# Patient Record
Sex: Female | Born: 1995 | Race: Asian | Hispanic: No | Marital: Single | Smoking: Never smoker
Health system: Southern US, Community
[De-identification: ages and names within clinical notes are randomized; demographics above are authoritative.]

## PROBLEM LIST (undated history)

## (undated) ENCOUNTER — Inpatient Hospital Stay: Payer: Self-pay

## (undated) DIAGNOSIS — Z789 Other specified health status: Secondary | ICD-10-CM

## (undated) DIAGNOSIS — Z8619 Personal history of other infectious and parasitic diseases: Secondary | ICD-10-CM

## (undated) HISTORY — PX: OTHER SURGICAL HISTORY: SHX169

## (undated) HISTORY — DX: Personal history of other infectious and parasitic diseases: Z86.19

## (undated) HISTORY — PX: NO PAST SURGERIES: SHX2092

---
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
## 2016-08-06 ENCOUNTER — Ambulatory Visit (INDEPENDENT_AMBULATORY_CARE_PROVIDER_SITE_OTHER): Payer: Self-pay | Admitting: Obstetrics and Gynecology

## 2016-08-06 VITALS — BP 111/76 | HR 62 | Ht 59.0 in | Wt 103.0 lb

## 2016-08-06 DIAGNOSIS — Z1389 Encounter for screening for other disorder: Secondary | ICD-10-CM

## 2016-08-06 DIAGNOSIS — Z113 Encounter for screening for infections with a predominantly sexual mode of transmission: Secondary | ICD-10-CM

## 2016-08-06 DIAGNOSIS — Z3687 Encounter for antenatal screening for uncertain dates: Secondary | ICD-10-CM

## 2016-08-06 DIAGNOSIS — Z8619 Personal history of other infectious and parasitic diseases: Secondary | ICD-10-CM | POA: Insufficient documentation

## 2016-08-06 DIAGNOSIS — N912 Amenorrhea, unspecified: Secondary | ICD-10-CM

## 2016-08-06 DIAGNOSIS — Z3401 Encounter for supervision of normal first pregnancy, first trimester: Secondary | ICD-10-CM

## 2016-08-06 NOTE — Patient Instructions (Signed)
Pregnancy and Zika Virus Disease Introduction Zika virus disease, or Zika, is an illness that can spread to people from mosquitoes that carry the virus. It may also spread from person to person through infected body fluids. Zika first occurred in [place], but recently it has spread to new areas. The virus occurs in tropical climates. The location of Zika continues to change. Most people who become infected with Zika virus do not develop serious illness. However, Zika may cause birth defects in an unborn baby whose mother is infected with the virus. It may also increase the risk of miscarriage. What are the symptoms of Zika virus disease? In many cases, people who have been infected with Zika virus do not develop any symptoms. If symptoms appear, they usually start about a week after the person is infected. Symptoms are usually mild. They may include:  Fever.  Rash.  Red eyes.  Joint pain. How does Zika virus disease spread? The main way that Zika virus spreads is through the bite of a certain type of mosquito. Unlike most types of mosquitos, which bite only at night, the type of mosquito that carries Zika virus bites both at night and during the day. Zika virus can also spread through sexual contact, through a blood transfusion, and from a mother to her baby before or during birth. Once you have had Zika virus disease, it is unlikely that you will get it again. Can I pass Zika to my baby during pregnancy? Yes, Zika can pass from a mother to her baby before or during birth. What problems can Zika cause for my baby? A woman who is infected with Zika virus while pregnant is at risk of having her baby born with a condition in which the brain or head is smaller than expected (microcephaly). Babies who have microcephaly can have developmental delays, seizures, hearing problems, and vision problems. Having Zika virus disease during pregnancy can also increase the risk of miscarriage. How can Zika  virus disease be prevented? There is no vaccine to prevent Zika. The best way to prevent the disease is to avoid infected mosquitoes and avoid exposure to body fluids that can spread the virus. Avoid any possible exposure to Zika by taking the following precautions. For women and their sex partners:  Avoid traveling to high-risk areas. The locations where Zika is being reported change often. To identify high-risk areas, check the CDC travel website: www.cdc.gov/zika/geo/index.html  If you or your sex partner must travel to a high-risk area, talk with a health care provider before and after traveling.  Take all precautions to avoid mosquito bites if you live in, or travel to, any of the high-risk areas. Insect repellents are safe to use during pregnancy.  Ask your health care provider when it is safe to have sexual contact. For women:  If you are pregnant or trying to become pregnant, avoid sexual contact with persons who may have been exposed to Zika virus, persons who have possible symptoms of Zika, or persons whose history you are unsure about. If you choose to have sexual contact with someone who may have been exposed to Zika virus, use condoms correctly during the entire duration of sexual activity, every time. Do not share sexual devices, as you may be exposed to body fluids.  Ask your health care provider about when it is safe to attempt pregnancy after a possible exposure to Zika virus. What steps should I take to avoid mosquito bites? Take these steps to avoid mosquito bites when you   are in a high-risk area:  Wear loose clothing that covers your arms and legs.  Limit your outdoor activities.  Do not open windows unless they have window screens.  Sleep under mosquito nets.  Use insect repellent. The best insect repellents have:  DEET, picaridin, oil of lemon eucalyptus (OLE), or IR3535 in them.  Higher amounts of an active ingredient in them.  Remember that insect repellents  are safe to use during pregnancy.  Do not use OLE on children who are younger than 3 years of age. Do not use insect repellent on babies who are younger than 2 months of age.  Cover your child's stroller with mosquito netting. Make sure the netting fits snugly and that any loose netting does not cover your child's mouth or nose. Do not use a blanket as a mosquito-protection cover.  Do not apply insect repellent underneath clothing.  If you are using sunscreen, apply the sunscreen before applying the insect repellent.  Treat clothing with permethrin. Do not apply permethrin directly to your skin. Follow label directions for safe use.  Get rid of standing water, where mosquitoes may reproduce. Standing water is often found in items such as buckets, bowls, animal food dishes, and flowerpots. When you return from traveling to any high-risk area, continue taking actions to protect yourself against mosquito bites for 3 weeks, even if you show no signs of illness. This will prevent spreading Zika virus to uninfected mosquitoes. What should I know about the sexual transmission of Zika? People can spread Zika to their sexual partners during vaginal, anal, or oral sex, or by sharing sexual devices. Many people with Zika do not develop symptoms, so a person could spread the disease without knowing that they are infected. The greatest risk is to women who are pregnant or who may become pregnant. Zika virus can live longer in semen than it can live in blood. Couples can prevent sexual transmission of the virus by:  Using condoms correctly during the entire duration of sexual activity, every time. This includes vaginal, anal, and oral sex.  Not sharing sexual devices. Sharing increases your risk of being exposed to body fluid from another person.  Avoiding all sexual activity until your health care provider says it is safe. Should I be tested for Zika virus? A sample of your blood can be tested for Zika  virus. A pregnant woman should be tested if she may have been exposed to the virus or if she has symptoms of Zika. She may also have additional tests done during her pregnancy, such ultrasound testing. Talk with your health care provider about which tests are recommended. This information is not intended to replace advice given to you by your health care provider. Make sure you discuss any questions you have with your health care provider. Document Released: 04/04/2015 Document Revised: 12/20/2015 Document Reviewed: 03/28/2015  2017 Elsevier Minor Illnesses and Medications in Pregnancy  Cold/Flu:  Sudafed for congestion- Robitussin (plain) for cough- Tylenol for discomfort.  Please follow the directions on the label.  Try not to take any more than needed.  OTC Saline nasal spray and air humidifier or cool-mist  Vaporizer to sooth nasal irritation and to loosen congestion.  It is also important to increase intake of non carbonated fluids, especially if you have a fever.  Constipation:  Colace-2 capsules at bedtime; Metamucil- follow directions on label; Senokot- 1 tablet at bedtime.  Any one of these medications can be used.  It is also very important to increase   fluids and fruits along with regular exercise.  If problem persists please call the office.  Diarrhea:  Kaopectate as directed on the label.  Eat a bland diet and increase fluids.  Avoid highly seasoned foods.  Headache:  Tylenol 1 or 2 tablets every 3-4 hours as needed  Indigestion:  Maalox, Mylanta, Tums or Rolaids- as directed on label.  Also try to eat small meals and avoid fatty, greasy or spicy foods.  Nausea with or without Vomiting:  Nausea in pregnancy is caused by increased levels of hormones in the body which influence the digestive system and cause irritation when stomach acids accumulate.  Symptoms usually subside after 1st trimester of pregnancy.  Try the following: 1. Keep saltines, graham crackers or dry toast by your bed to  eat upon awakening. 2. Don't let your stomach get empty.  Try to eat 5-6 small meals per day instead of 3 large ones. 3. Avoid greasy fatty or highly seasoned foods.  4. Take OTC Unisom 1 tablet at bed time along with OTC Vitamin B6 25-50 mg 3 times per day.    If nausea continues with vomiting and you are unable to keep down food and fluids you may need a prescription medication.  Please notify your provider.   Sore throat:  Chloraseptic spray, throat lozenges and or plain Tylenol.  Vaginal Yeast Infection:  OTC Monistat for 7 days as directed on label.  If symptoms do not resolve within a week notify provider.  If any of the above problems do not subside with recommended treatment please call the office for further assistance.   Do not take Aspirin, Advil, Motrin or Ibuprofen.  * * OTC= Over the counter Hyperemesis Gravidarum Hyperemesis gravidarum is a severe form of nausea and vomiting that happens during pregnancy. Hyperemesis is worse than morning sickness. It may cause you to have nausea or vomiting all day for many days. It may keep you from eating and drinking enough food and liquids. Hyperemesis usually occurs during the first half (the first 20 weeks) of pregnancy. It often goes away once a woman is in her second half of pregnancy. However, sometimes hyperemesis continues through an entire pregnancy. What are the causes? The cause of this condition is not known. It may be related to changes in chemicals (hormones) in the body during pregnancy, such as the high level of pregnancy hormone (human chorionic gonadotropin) or the increase in the female sex hormone (estrogen). What are the signs or symptoms? Symptoms of this condition include:  Severe nausea and vomiting.  Nausea that does not go away.  Vomiting that does not allow you to keep any food down.  Weight loss.  Body fluid loss (dehydration).  Having no desire to eat, or not liking food that you have previously  enjoyed. How is this diagnosed? This condition may be diagnosed based on:  A physical exam.  Your medical history.  Your symptoms.  Blood tests.  Urine tests. How is this treated? This condition may be managed with medicine. If medicines to do not help relieve nausea and vomiting, you may need to receive fluids through an IV tube at the hospital. Follow these instructions at home:  Take over-the-counter and prescription medicines only as told by your health care provider.  Avoid iron pills and multivitamins that contain iron for the first 3-4 months of pregnancy. If you take prescription iron pills, do not stop taking them unless your health care provider approves.  Take the following actions to help   prevent nausea and vomiting:  In the morning, before getting out of bed, try eating a couple of dry crackers or a piece of toast.  Avoid foods and smells that upset your stomach. Fatty and spicy foods may make nausea worse.  Eat 5-6 small meals a day.  Do not drink fluids while eating meals. Drink between meals.  Eat or suck on things that have ginger in them. Ginger can help relieve nausea.  Avoid food preparation. The smell of food can spoil your appetite or trigger nausea.  Follow instructions from your health care provider about eating or drinking restrictions.  For snacks, eat high-protein foods, such as cheese.  Keep all follow-up and pre-birth (prenatal) visits as told by your health care provider. This is important. Contact a health care provider if:  You have pain in your abdomen.  You have a severe headache.  You have vision problems.  You are losing weight. Get help right away if:  You cannot drink fluids without vomiting.  You vomit blood.  You have constant nausea and vomiting.  You are very weak.  You are very thirsty.  You feel dizzy.  You faint.  You have a fever or other symptoms that last for more than 2-3 days.  You have a fever and  your symptoms suddenly get worse. Summary  Hyperemesis gravidarum is a severe form of nausea and vomiting that happens during pregnancy.  Making some changes to your eating habits may help relieve nausea and vomiting.  This condition may be managed with medicine.  If medicines to do not help relieve nausea and vomiting, you may need to receive fluids through an IV tube at the hospital. This information is not intended to replace advice given to you by your health care provider. Make sure you discuss any questions you have with your health care provider. Document Released: 07/14/2005 Document Revised: 03/12/2016 Document Reviewed: 03/12/2016 Elsevier Interactive Patient Education  2017 [employer] First Trimester of Pregnancy The first trimester of pregnancy is from week 1 until the end of week 12 (months 1 through 3). During this time, your baby will begin to develop inside you. At 6-8 weeks, the eyes and face are formed, and the heartbeat can be seen on ultrasound. At the end of 12 weeks, all the baby's organs are formed. Prenatal care is all the medical care you receive before the birth of your baby. Make sure you get good prenatal care and follow all of your doctor's instructions. Follow these instructions at home: Medicines  Take medicine only as told by your doctor. Some medicines are safe and some are not during pregnancy.  Take your prenatal vitamins as told by your doctor.  Take medicine that helps you poop (stool softener) as needed if your doctor says it is okay. Diet  Eat regular, healthy meals.  Your doctor will tell you the amount of weight gain that is right for you.  Avoid raw meat and uncooked cheese.  If you feel sick to your stomach (nauseous) or throw up (vomit):  Eat 4 or 5 small meals a day instead of 3 large meals.  Try eating a few soda crackers.  Drink liquids between meals instead of during meals.  If you have a hard time pooping  (constipation):  Eat high-fiber foods like fresh vegetables, fruit, and whole grains.  Drink enough fluids to keep your pee (urine) clear or pale yellow. Activity and Exercise  Exercise only as told by your doctor. Stop exercising if   you have cramps or pain in your lower belly (abdomen) or low back.  Try to avoid standing for long periods of time. Move your legs often if you must stand in one place for a long time.  Avoid heavy lifting.  Wear low-heeled shoes. Sit and stand up straight.  You can have sex unless your doctor tells you not to. Relief of Pain or Discomfort  Wear a good support bra if your breasts are sore.  Take warm water baths (sitz baths) to soothe pain or discomfort caused by hemorrhoids. Use hemorrhoid cream if your doctor says it is okay.  Rest with your legs raised if you have leg cramps or low back pain.  Wear support hose if you have puffy, bulging veins (varicose veins) in your legs. Raise (elevate) your feet for 15 minutes, 3-4 times a day. Limit salt in your diet. Prenatal Care  Schedule your prenatal visits by the twelfth week of pregnancy.  Write down your questions. Take them to your prenatal visits.  Keep all your prenatal visits as told by your doctor. Safety  Wear your seat belt at all times when driving.  Make a list of emergency phone numbers. The list should include numbers for family, friends, the hospital, and police and fire departments. General Tips  Ask your doctor for a referral to a local prenatal class. Begin classes no later than at the start of month 6 of your pregnancy.  Ask for help if you need counseling or help with nutrition. Your doctor can give you advice or tell you where to go for help.  Do not use hot tubs, steam rooms, or saunas.  Do not douche or use tampons or scented sanitary pads.  Do not cross your legs for long periods of time.  Avoid litter boxes and soil used by cats.  Avoid all smoking, herbs, and  alcohol. Avoid drugs not approved by your doctor.  Do not use any tobacco products, including cigarettes, chewing tobacco, and electronic cigarettes. If you need help quitting, ask your doctor. You may get counseling or other support to help you quit.  Visit your dentist. At home, brush your teeth with a soft toothbrush. Be gentle when you floss. Get help if:  You are dizzy.  You have mild cramps or pressure in your lower belly.  You have a nagging pain in your belly area.  You continue to feel sick to your stomach, throw up, or have watery poop (diarrhea).  You have a bad smelling fluid coming from your vagina.  You have pain with peeing (urination).  You have increased puffiness (swelling) in your face, hands, legs, or ankles. Get help right away if:  You have a fever.  You are leaking fluid from your vagina.  You have spotting or bleeding from your vagina.  You have very bad belly cramping or pain.  You gain or lose weight rapidly.  You throw up blood. It may look like coffee grounds.  You are around people who have German measles, fifth disease, or chickenpox.  You have a very bad headache.  You have shortness of breath.  You have any kind of trauma, such as from a fall or a car accident. This information is not intended to replace advice given to you by your health care provider. Make sure you discuss any questions you have with your health care provider. Document Released: 12/31/2007 Document Revised: 12/20/2015 Document Reviewed: 05/24/2013 Elsevier Interactive Patient Education  2017 [employer] Inc. Commonly Asked Questions   During Pregnancy  Cats: A parasite can be excreted in cat feces.  To avoid exposure you need to have another person empty the little box.  If you must empty the litter box you will need to wear gloves.  Wash your hands after handling your cat.  This parasite can also be found in raw or undercooked meat so this should also be avoided.  Colds,  Sore Throats, Flu: Please check your medication sheet to see what you can take for symptoms.  If your symptoms are unrelieved by these medications please call the office.  Dental Work: Most any dental work your dentist recommends is permitted.  X-rays should only be taken during the first trimester if absolutely necessary.  Your abdomen should be shielded with a lead apron during all x-rays.  Please notify your provider prior to receiving any x-rays.  Novocaine is fine; gas is not recommended.  If your dentist requires a note from us prior to dental work please call the office and we will provide one for you.  Exercise: Exercise is an important part of staying healthy during your pregnancy.  You may continue most exercises you were accustomed to prior to pregnancy.  Later in your pregnancy you will most likely notice you have difficulty with activities requiring balance like riding a bicycle.  It is important that you listen to your body and avoid activities that put you at a higher risk of falling.  Adequate rest and staying well hydrated are a must!  If you have questions about the safety of specific activities ask your provider.    Exposure to Children with illness: Try to avoid obvious exposure; report any symptoms to us when noted,  If you have chicken pos, red measles or mumps, you should be immune to these diseases.   Please do not take any vaccines while pregnant unless you have checked with your OB provider.  Fetal Movement: After 28 weeks we recommend you do "kick counts" twice daily.  Lie or sit down in a calm quiet environment and count your baby movements "kicks".  You should feel your baby at least 10 times per hour.  If you have not felt 10 kicks within the first hour get up, walk around and have something sweet to eat or drink then repeat for an additional hour.  If count remains less than 10 per hour notify your provider.  Fumigating: Follow your pest control agent's advice as to how long  to stay out of your home.  Ventilate the area well before re-entering.  Hemorrhoids:   Most over-the-counter preparations can be used during pregnancy.  Check your medication to see what is safe to use.  It is important to use a stool softener or fiber in your diet and to drink lots of liquids.  If hemorrhoids seem to be getting worse please call the office.   Hot Tubs:  Hot tubs Jacuzzis and saunas are not recommended while pregnant.  These increase your internal body temperature and should be avoided.  Intercourse:  Sexual intercourse is safe during pregnancy as long as you are comfortable, unless otherwise advised by your provider.  Spotting may occur after intercourse; report any bright red bleeding that is heavier than spotting.  Labor:  If you know that you are in labor, please go to the hospital.  If you are unsure, please call the office and let us help you decide what to do.  Lifting, straining, etc:  If your job requires heavy lifting or   straining please check with your provider for any limitations.  Generally, you should not lift items heavier than that you can lift simply with your hands and arms (no back muscles)  Painting:  Paint fumes do not harm your pregnancy, but may make you ill and should be avoided if possible.  Latex or water based paints have less odor than oils.  Use adequate ventilation while painting.  Permanents & Hair Color:  Chemicals in hair dyes are not recommended as they cause increase hair dryness which can increase hair loss during pregnancy.  " Highlighting" and permanents are allowed.  Dye may be absorbed differently and permanents may not hold as well during pregnancy.  Sunbathing:  Use a sunscreen, as skin burns easily during pregnancy.  Drink plenty of fluids; avoid over heating.  Tanning Beds:  Because their possible side effects are still unknown, tanning beds are not recommended.  Ultrasound Scans:  Routine ultrasounds are performed at approximately 20  weeks.  You will be able to see your baby's general anatomy an if you would like to know the gender this can usually be determined as well.  If it is questionable when you conceived you may also receive an ultrasound early in your pregnancy for dating purposes.  Otherwise ultrasound exams are not routinely performed unless there is a medical necessity.  Although you can request a scan we ask that you pay for it when conducted because insurance does not cover " patient request" scans.  Work: If your pregnancy proceeds without complications you may work until your due date, unless your physician or employer advises otherwise.  Round Ligament Pain/Pelvic Discomfort:  Sharp, shooting pains not associated with bleeding are fairly common, usually occurring in the second trimester of pregnancy.  They tend to be worse when standing up or when you remain standing for long periods of time.  These are the result of pressure of certain pelvic ligaments called "round ligaments".  Rest, Tylenol and heat seem to be the most effective relief.  As the womb and fetus grow, they rise out of the pelvis and the discomfort improves.  Please notify the office if your pain seems different than that described.  It may represent a more serious condition.   

## 2016-08-06 NOTE — Progress Notes (Signed)
Jaime Simmons presents for NOB nurse interview visit. [facility]  LMP: 06/17/2016-approximate.  G-1.  P-0000. Pregnancy education material explained and given. No cats in the home. NOB labs ordered.  HIV labs and Drug screen were explained optional and she did not decline. Drug screen ordered. Has hx genital herpes. Pt states she was smoking marijuana but has now decreased to 1x weekly and is quitting. PNV encouraged. Genetic screening, pt may want to have the MaternIT done and will need to schedule about 10 wks or may wait to discuss with provider. Pt. To follow up with provider in _4_ weeks for NOB physical.  All questions answered.

## 2016-08-08 ENCOUNTER — Ambulatory Visit (INDEPENDENT_AMBULATORY_CARE_PROVIDER_SITE_OTHER): Payer: Medicaid Other

## 2016-08-08 DIAGNOSIS — Z3687 Encounter for antenatal screening for uncertain dates: Secondary | ICD-10-CM

## 2016-08-08 DIAGNOSIS — Z3401 Encounter for supervision of normal first pregnancy, first trimester: Secondary | ICD-10-CM | POA: Diagnosis not present

## 2016-08-09 LAB — CULTURE, OB URINE

## 2016-08-09 LAB — CBC WITH DIFFERENTIAL/PLATELET
BASOS ABS: 0 10*3/uL (ref 0.0–0.2)
BASOS: 0 %
EOS (ABSOLUTE): 0 10*3/uL (ref 0.0–0.4)
Eos: 1 %
Hematocrit: 39 % (ref 34.0–46.6)
Hemoglobin: 13.4 g/dL (ref 11.1–15.9)
Immature Grans (Abs): 0 10*3/uL (ref 0.0–0.1)
Immature Granulocytes: 0 %
Lymphocytes Absolute: 2.4 10*3/uL (ref 0.7–3.1)
Lymphs: 33 %
MCH: 25.8 pg — AB (ref 26.6–33.0)
MCHC: 34.4 g/dL (ref 31.5–35.7)
MCV: 75 fL — AB (ref 79–97)
MONOS ABS: 0.6 10*3/uL (ref 0.1–0.9)
Monocytes: 8 %
NEUTROS PCT: 58 %
Neutrophils Absolute: 4.1 10*3/uL (ref 1.4–7.0)
PLATELETS: 207 10*3/uL (ref 150–379)
RBC: 5.2 x10E6/uL (ref 3.77–5.28)
RDW: 15.3 % (ref 12.3–15.4)
WBC: 7.1 10*3/uL (ref 3.4–10.8)

## 2016-08-09 LAB — ABO

## 2016-08-09 LAB — URINE CULTURE, OB REFLEX: Organism ID, Bacteria: NO GROWTH

## 2016-08-09 LAB — HEPATITIS B SURFACE ANTIGEN: HEP B S AG: NEGATIVE

## 2016-08-09 LAB — ANTIBODY SCREEN: ANTIBODY SCREEN: NEGATIVE

## 2016-08-09 LAB — GC/CHLAMYDIA PROBE AMP
Chlamydia trachomatis, NAA: NEGATIVE
NEISSERIA GONORRHOEAE BY PCR: NEGATIVE

## 2016-08-09 LAB — RPR: RPR Ser Ql: NONREACTIVE

## 2016-08-09 LAB — HIV ANTIBODY (ROUTINE TESTING W REFLEX): HIV Screen 4th Generation wRfx: NONREACTIVE

## 2016-08-09 LAB — SICKLE CELL SCREEN: SICKLE CELL SCREEN: POSITIVE — AB

## 2016-08-09 LAB — VARICELLA ZOSTER ANTIBODY, IGG: VARICELLA: 605 {index} (ref >165)

## 2016-08-09 LAB — RUBELLA SCREEN: Rubella Antibodies, IGG: 26.2 index (ref >0.99)

## 2016-08-09 LAB — RH TYPE: Rh Factor: POSITIVE

## 2016-08-16 LAB — URINALYSIS, ROUTINE W REFLEX MICROSCOPIC
Bilirubin, UA: NEGATIVE
Leukocytes, UA: NEGATIVE
Nitrite, UA: NEGATIVE
PH UA: 5 (ref 5.0–7.5)
Protein, UA: NEGATIVE
RBC, UA: NEGATIVE
Specific Gravity, UA: >=1.03 — AB (ref 1.005–1.030)
UUROB: 0.2 mg/dL (ref 0.2–1.0)

## 2016-08-16 LAB — MONITOR DRUG PROFILE 14(MW)
Amphetamine Scrn, Ur: NEGATIVE ng/mL
BARBITURATE SCREEN URINE: NEGATIVE ng/mL
BENZODIAZEPINE SCREEN, URINE: NEGATIVE ng/mL
BUPRENORPHINE, URINE: NEGATIVE ng/mL
CREATININE(CRT), U: 151.3 mg/dL (ref 20.0–300.0)
Cocaine (Metab) Scrn, Ur: NEGATIVE ng/mL
Fentanyl, Urine: NEGATIVE pg/mL
MEPERIDINE SCREEN, URINE: NEGATIVE ng/mL
METHADONE SCREEN, URINE: NEGATIVE ng/mL
OPIATE SCREEN URINE: NEGATIVE ng/mL
OXYCODONE+OXYMORPHONE UR QL SCN: NEGATIVE ng/mL
PH UR, DRUG SCRN: 5.4 (ref 4.5–8.9)
PHENCYCLIDINE QUANTITATIVE URINE: NEGATIVE ng/mL
Propoxyphene Scrn, Ur: NEGATIVE ng/mL
SPECIFIC GRAVITY: 1.021
Tramadol Screen, Urine: NEGATIVE ng/mL

## 2016-08-16 LAB — NICOTINE SCREEN, URINE: Cotinine Ql Scrn, Ur: NEGATIVE ng/mL

## 2016-08-16 LAB — CANNABINOID (GC/MS), URINE
CARBOXY THC UR: 21 ng/mL
Cannabinoid: POSITIVE — AB

## 2016-08-18 ENCOUNTER — Telehealth: Payer: Self-pay

## 2016-08-18 DIAGNOSIS — Z131 Encounter for screening for diabetes mellitus: Secondary | ICD-10-CM

## 2016-08-18 NOTE — Telephone Encounter (Signed)
Called pt informed her of the information below, pt gave verbal understanding. Stressed importance of having partner tested.

## 2016-08-18 NOTE — Telephone Encounter (Signed)
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  Would recommend partner testing.   Also with 3+ glucosuria, will need early glucola to r/o diabetes when she comes for her NOB visit as long as she is not having any nausea/vomting.

## 2016-08-28 ENCOUNTER — Telehealth: Payer: Self-pay | Admitting: Obstetrics and Gynecology

## 2016-08-28 NOTE — Telephone Encounter (Signed)
Called pt she states that she is concerned because she will not be able to make it to her 09/04/16 appt due to work. Pt states that she has strict restrictions regarding leaving work as she has only been working there a few days. Pt states she works 8-5. Advised pt that although it is not ideal we can push her appt out a few weeks until she has completed training. 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 Advised pt to call back sooner if issues arise. Pt gave verbal understanding.

## 2016-08-28 NOTE — Telephone Encounter (Signed)
CALLER STATED SHE HAD AN EMERGENCY QUESTIONS

## 2016-08-29 ENCOUNTER — Other Ambulatory Visit: Payer: Self-pay

## 2016-08-29 ENCOUNTER — Encounter: Payer: Self-pay | Admitting: Certified Nurse Midwife

## 2016-08-29 ENCOUNTER — Ambulatory Visit (INDEPENDENT_AMBULATORY_CARE_PROVIDER_SITE_OTHER): Payer: Medicaid Other | Admitting: Certified Nurse Midwife

## 2016-08-29 VITALS — BP 106/68 | HR 72 | Wt 101.2 lb

## 2016-08-29 DIAGNOSIS — Z3491 Encounter for supervision of normal pregnancy, unspecified, first trimester: Secondary | ICD-10-CM

## 2016-08-29 DIAGNOSIS — Z131 Encounter for screening for diabetes mellitus: Secondary | ICD-10-CM

## 2016-08-29 LAB — POCT URINALYSIS DIPSTICK
Bilirubin, UA: NEGATIVE
Blood, UA: NEGATIVE
Glucose, UA: NEGATIVE
Ketones, UA: NEGATIVE
NITRITE UA: NEGATIVE
Protein, UA: NEGATIVE
Spec Grav, UA: 1.025
UROBILINOGEN UA: NEGATIVE
pH, UA: 5

## 2016-08-29 NOTE — Progress Notes (Signed)
Has had 2 episodes of vomiting denies nausea.

## 2016-08-29 NOTE — Patient Instructions (Signed)
Second Trimester of Pregnancy The second trimester is from week 13 through week 28, month 4 through 6. This is often the time in pregnancy that you feel your best. Often times, morning sickness has lessened or quit. You may have more energy, and you may get hungry more often. Your unborn baby (fetus) is growing rapidly. At the end of the sixth month, he or she is about 9 inches long and weighs about 1 pounds. You will likely feel the baby move (quickening) between 18 and 20 weeks of pregnancy. Follow these instructions at home:  Avoid all smoking, herbs, and alcohol. Avoid drugs not approved by your doctor.  Do not use any tobacco products, including cigarettes, chewing tobacco, and electronic cigarettes. If you need help quitting, ask your doctor. You may get counseling or other support to help you quit.  Only take medicine as told by your doctor. Some medicines are safe and some are not during pregnancy.  Exercise only as told by your doctor. Stop exercising if you start having cramps.  Eat regular, healthy meals.  Wear a good support bra if your breasts are tender.  Do not use hot tubs, steam rooms, or saunas.  Wear your seat belt when driving.  Avoid raw meat, uncooked cheese, and liter boxes and soil used by cats.  Take your prenatal vitamins.  Take 1500-2000 milligrams of calcium daily starting at the 20th week of pregnancy until you deliver your baby.  Try taking medicine that helps you poop (stool softener) as needed, and if your doctor approves. Eat more fiber by eating fresh fruit, vegetables, and whole grains. Drink enough fluids to keep your pee (urine) clear or pale yellow.  Take warm water baths (sitz baths) to soothe pain or discomfort caused by hemorrhoids. Use hemorrhoid cream if your doctor approves.  If you have puffy, bulging veins (varicose veins), wear support hose. Raise (elevate) your feet for 15 minutes, 3-4 times a day. Limit salt in your diet.  Avoid heavy  lifting, wear low heals, and sit up straight.  Rest with your legs raised if you have leg cramps or low back pain.  Visit your dentist if you have not gone during your pregnancy. Use a soft toothbrush to brush your teeth. Be gentle when you floss.  You can have sex (intercourse) unless your doctor tells you not to.  Go to your doctor visits. Get help if:  You feel dizzy.  You have mild cramps or pressure in your lower belly (abdomen).  You have a nagging pain in your belly area.  You continue to feel sick to your stomach (nauseous), throw up (vomit), or have watery poop (diarrhea).  You have bad smelling fluid coming from your vagina.  You have pain with peeing (urination). Get help right away if:  You have a fever.  You are leaking fluid from your vagina.  You have spotting or bleeding from your vagina.  You have severe belly cramping or pain.  You lose or gain weight rapidly.  You have trouble catching your breath and have chest pain.  You notice sudden or extreme puffiness (swelling) of your face, hands, ankles, feet, or legs.  You have not felt the baby move in over an hour.  You have severe headaches that do not go away with medicine.  You have vision changes. This information is not intended to replace advice given to you by your health care provider. Make sure you discuss any questions you have with your health care   provider. Document Released: 10/08/2009 Document Revised: 12/20/2015 Document Reviewed: 09/14/2012 Elsevier Interactive Patient Education  2017 Elsevier Inc.  

## 2016-08-29 NOTE — Progress Notes (Signed)
NEW OB HISTORY AND PHYSICAL  SUBJECTIVE:       Jaime Simmons is a 21 y.o. G1P0 female, Patient's last menstrual period was 06/17/2016 (approximate)., Estimated Date of Delivery: 03/24/17, [id number], presents today for establishment of Prenatal Care.  She complains of nausea with vomiting for the last two (2) days. Denies difficulty breathing or respiratory distress, chest pain, abdominal pain, vaginal bleeding, and leg pain or swelling.   Jaime Simmons is starting a new job [place] the [employer].   She is aware that her sickle cell screen was positive. The FOB reports that he does not carry the sickle cell trait.    Gynecologic History  Patient's last menstrual period was 06/17/2016 (approximate).   Obstetric History OB History  Gravida Para Term Preterm AB Living  1            SAB TAB Ectopic Multiple Live Births               # Outcome Date GA Lbr Len/2nd Weight Sex Delivery Anes PTL Lv  1 Current               Past Medical History:  Diagnosis Date  . H/O herpes genitalis     Past Surgical History:  Procedure Laterality Date  . none      Current Outpatient Prescriptions on File Prior to Visit  Medication Sig Dispense Refill  . acyclovir (ZOVIRAX) 800 MG tablet Take 800 mg by mouth 5 (five) times daily.    . Prenatal Vit-Fe Fumarate-FA (PRENATAL VITAMINS) 28-0.8 MG TABS Take 1 tablet by mouth daily.     No current facility-administered medications on file prior to visit.     No Known Allergies  Social History   Social History  . Marital status: Single    Spouse name: N/A  . Number of children: N/A  . Years of education: N/A   Occupational History  . Not on file.   Social History Main Topics  . Smoking status: Never Smoker  . Smokeless tobacco: Never Used  . Alcohol use No     Comment: occasionally  . Drug use: No  . Sexual activity: Yes    Partners: Male   Other Topics Concern  . Not on file   Social History Narrative  . No narrative on file    History  reviewed. No pertinent family history.  The following portions of the patient's history were reviewed and updated as appropriate: allergies, current medications, past OB history, past medical history, past surgical history, past family history, past social history, and problem list.  OBJECTIVE: Initial Physical Exam (New OB)  GENERAL APPEARANCE: alert, well appearing, in no apparent distress  HEAD: normocephalic, atraumatic  MOUTH: mucous membranes moist, pharynx normal without lesions and dental hygiene good  THYROID: no thyromegaly or masses present  BREASTS: no masses noted, no significant tenderness, no palpable axillary nodes, no skin changes  LUNGS: clear to auscultation, no wheezes, rales or rhonchi, symmetric air entry  HEART: regular rate and rhythm, no murmurs  ABDOMEN: soft, nontender, nondistended, no abnormal masses, no epigastric pain  EXTREMITIES: no redness or tenderness in the calves or thighs  SKIN: normal coloration and turgor, no rashes  LYMPH NODES: no adenopathy palpable  NEUROLOGIC: alert, oriented, normal speech, no focal findings or movement disorder noted  PELVIC EXAM EXTERNAL GENITALIA: normal appearing vulva with no masses, tenderness or lesions VAGINA: no abnormal discharge or lesions CERVIX: no lesions or cervical motion tenderness UTERUS: gravid ADNEXA: no  masses palpable and nontender  ASSESSMENT: Normal pregnancy Desires genetic screening   PLAN: Prenatal care Early glucola and MaterniT today Reviewed red flag symptoms and when to call RTC x 4 weeks for ROB See orders   Gunnar Bulla, CNM

## 2016-08-30 LAB — GLUCOSE, 1 HOUR GESTATIONAL: Gestational Diabetes Screen: 74 mg/dL (ref 65–139)

## 2016-09-04 ENCOUNTER — Encounter: Payer: Medicaid Other | Admitting: Obstetrics and Gynecology

## 2016-09-04 ENCOUNTER — Other Ambulatory Visit: Payer: Self-pay

## 2016-09-06 LAB — MATERNIT 21 PLUS CORE, BLOOD
Chromosome 13: NEGATIVE
Chromosome 18: NEGATIVE
Chromosome 21: NEGATIVE
Y Chromosome: NOT DETECTED

## 2016-09-09 NOTE — Progress Notes (Signed)
Would you contact patient with MaterniT results. Low risk, gender female if desired. Thanks, JML

## 2016-09-10 ENCOUNTER — Telehealth: Payer: Self-pay

## 2016-09-10 NOTE — Telephone Encounter (Signed)
Left message on oatients voice mail to please reutrn call.

## 2016-09-10 NOTE — Telephone Encounter (Signed)
-----   Message from Jenkins Michelle Lawhorn, CNM sent at 09/09/2016  5:50 PM EST ----- Would you contact patient with MaterniT results. Low risk, gender female if desired. Thanks, JML 

## 2016-09-10 NOTE — Telephone Encounter (Signed)
Left message on pts voicemail to return call.

## 2016-09-11 NOTE — Telephone Encounter (Signed)
Message left on patients voice mail of low risk per provider. Also pt may call to receive the gender.

## 2016-09-11 NOTE — Telephone Encounter (Signed)
-----   Message from Gunnar BullaJenkins Michelle Lawhorn, CNM sent at 09/09/2016  5:50 PM EST ----- Would you contact patient with MaterniT results. Low risk, gender female if desired. Thanks, JML

## 2016-09-26 ENCOUNTER — Encounter: Payer: Self-pay | Admitting: Certified Nurse Midwife

## 2016-09-26 ENCOUNTER — Ambulatory Visit (INDEPENDENT_AMBULATORY_CARE_PROVIDER_SITE_OTHER): Payer: Medicaid Other | Admitting: Certified Nurse Midwife

## 2016-09-26 VITALS — BP 108/80 | HR 77 | Wt 103.4 lb

## 2016-09-26 DIAGNOSIS — Z3402 Encounter for supervision of normal first pregnancy, second trimester: Secondary | ICD-10-CM

## 2016-09-26 LAB — POCT URINALYSIS DIPSTICK
Bilirubin, UA: NEGATIVE
Glucose, UA: NEGATIVE
Ketones, UA: NEGATIVE
Leukocytes, UA: NEGATIVE
NITRITE UA: NEGATIVE
PH UA: 5
PROTEIN UA: NEGATIVE
RBC UA: NEGATIVE
UROBILINOGEN UA: NEGATIVE

## 2016-09-26 NOTE — Addendum Note (Signed)
Addended by: Brooke DareSICK, Luke Falero L on: 09/26/2016 04:57 PM   Modules accepted: Orders

## 2016-09-26 NOTE — Progress Notes (Signed)
ROB-Pt doing well. WIC visit yesterday and is now drinking Ensure. FOB-Works at [employer], advised for him to get sickle cell screen at work. Pt loves her new job. Reviewed red flag symptoms and when to call. RTC x 5 weeks for anatomy scan and ROB.

## 2016-09-26 NOTE — Patient Instructions (Signed)
Second Trimester of Pregnancy The second trimester is from week 13 through week 28, month 4 through 6. This is often the time in pregnancy that you feel your best. Often times, morning sickness has lessened or quit. You may have more energy, and you may get hungry more often. Your unborn baby (fetus) is growing rapidly. At the end of the sixth month, he or she is about 9 inches long and weighs about 1 pounds. You will likely feel the baby move (quickening) between 18 and 20 weeks of pregnancy. Follow these instructions at home:  Avoid all smoking, herbs, and alcohol. Avoid drugs not approved by your doctor.  Do not use any tobacco products, including cigarettes, chewing tobacco, and electronic cigarettes. If you need help quitting, ask your doctor. You may get counseling or other support to help you quit.  Only take medicine as told by your doctor. Some medicines are safe and some are not during pregnancy.  Exercise only as told by your doctor. Stop exercising if you start having cramps.  Eat regular, healthy meals.  Wear a good support bra if your breasts are tender.  Do not use hot tubs, steam rooms, or saunas.  Wear your seat belt when driving.  Avoid raw meat, uncooked cheese, and liter boxes and soil used by cats.  Take your prenatal vitamins.  Take 1500-2000 milligrams of calcium daily starting at the 20th week of pregnancy until you deliver your baby.  Try taking medicine that helps you poop (stool softener) as needed, and if your doctor approves. Eat more fiber by eating fresh fruit, vegetables, and whole grains. Drink enough fluids to keep your pee (urine) clear or pale yellow.  Take warm water baths (sitz baths) to soothe pain or discomfort caused by hemorrhoids. Use hemorrhoid cream if your doctor approves.  If you have puffy, bulging veins (varicose veins), wear support hose. Raise (elevate) your feet for 15 minutes, 3-4 times a day. Limit salt in your diet.  Avoid heavy  lifting, wear low heals, and sit up straight.  Rest with your legs raised if you have leg cramps or low back pain.  Visit your dentist if you have not gone during your pregnancy. Use a soft toothbrush to brush your teeth. Be gentle when you floss.  You can have sex (intercourse) unless your doctor tells you not to.  Go to your doctor visits. Get help if:  You feel dizzy.  You have mild cramps or pressure in your lower belly (abdomen).  You have a nagging pain in your belly area.  You continue to feel sick to your stomach (nauseous), throw up (vomit), or have watery poop (diarrhea).  You have bad smelling fluid coming from your vagina.  You have pain with peeing (urination). Get help right away if:  You have a fever.  You are leaking fluid from your vagina.  You have spotting or bleeding from your vagina.  You have severe belly cramping or pain.  You lose or gain weight rapidly.  You have trouble catching your breath and have chest pain.  You notice sudden or extreme puffiness (swelling) of your face, hands, ankles, feet, or legs.  You have not felt the baby move in over an hour.  You have severe headaches that do not go away with medicine.  You have vision changes. This information is not intended to replace advice given to you by your health care provider. Make sure you discuss any questions you have with your health care   provider. Document Released: 10/08/2009 Document Revised: 12/20/2015 Document Reviewed: 09/14/2012 Elsevier Interactive Patient Education  2017 Elsevier Inc.  

## 2016-09-26 NOTE — Progress Notes (Signed)
Pt states the PNV dont agree with her.

## 2016-10-31 ENCOUNTER — Ambulatory Visit (INDEPENDENT_AMBULATORY_CARE_PROVIDER_SITE_OTHER): Payer: Medicaid Other | Admitting: Certified Nurse Midwife

## 2016-10-31 ENCOUNTER — Ambulatory Visit (INDEPENDENT_AMBULATORY_CARE_PROVIDER_SITE_OTHER): Payer: Medicaid Other

## 2016-10-31 ENCOUNTER — Encounter: Payer: Self-pay | Admitting: Certified Nurse Midwife

## 2016-10-31 VITALS — BP 103/63 | HR 82 | Wt 111.4 lb

## 2016-10-31 DIAGNOSIS — D573 Sickle-cell trait: Secondary | ICD-10-CM

## 2016-10-31 DIAGNOSIS — Z3402 Encounter for supervision of normal first pregnancy, second trimester: Secondary | ICD-10-CM

## 2016-10-31 DIAGNOSIS — O283 Abnormal ultrasonic finding on antenatal screening of mother: Secondary | ICD-10-CM

## 2016-10-31 LAB — POCT URINALYSIS DIPSTICK
BILIRUBIN UA: NEGATIVE
GLUCOSE UA: NEGATIVE
KETONES UA: NEGATIVE
Leukocytes, UA: NEGATIVE
Nitrite, UA: NEGATIVE
PH UA: 8 (ref 5.0–8.0)
Protein, UA: NEGATIVE
RBC UA: NEGATIVE
SPEC GRAV UA: 1.01 (ref 1.030–1.035)
Urobilinogen, UA: NEGATIVE (ref ?–2.0)

## 2016-10-31 NOTE — Progress Notes (Signed)
ROB and anatomy complete.

## 2016-10-31 NOTE — Patient Instructions (Signed)
Round Ligament Pain The round ligament is a cord of muscle and tissue that helps to support the uterus. It can become a source of pain during pregnancy if it becomes stretched or twisted as the baby grows. The pain usually begins in the second trimester of pregnancy, and it can come and go until the baby is delivered. It is not a serious problem, and it does not cause harm to the baby. Round ligament pain is usually a short, sharp, and pinching pain, but it can also be a dull, lingering, and aching pain. The pain is felt in the lower side of the abdomen or in the groin. It usually starts deep in the groin and moves up to the outside of the hip area. Pain can occur with:  A sudden change in position.  Rolling over in bed.  Coughing or sneezing.  Physical activity. Follow these instructions at home: Watch your condition for any changes. Take these steps to help with your pain:  When the pain starts, relax. Then try:  Sitting down.  Flexing your knees up to your abdomen.  Lying on your side with one pillow under your abdomen and another pillow between your legs.  Sitting in a warm bath for 15-20 minutes or until the pain goes away.  Take over-the-counter and prescription medicines only as told by your health care provider.  Move slowly when you sit and stand.  Avoid long walks if they cause pain.  Stop or lessen your physical activities if they cause pain. Contact a health care provider if:  Your pain does not go away with treatment.  You feel pain in your back that you did not have before.  Your medicine is not helping. Get help right away if:  You develop a fever or chills.  You develop uterine contractions.  You develop vaginal bleeding.  You develop nausea or vomiting.  You develop diarrhea.  You have pain when you urinate. This information is not intended to replace advice given to you by your health care provider. Make sure you discuss any questions you have with  your health care provider. Document Released: 04/22/2008 Document Revised: 12/20/2015 Document Reviewed: 09/20/2014 Elsevier Interactive Patient Education  2017 Elsevier Inc.  

## 2016-10-31 NOTE — Progress Notes (Signed)
ROB-Pt doing well, no questions or concerns. FOB sickle cell screen negative at work [employer]). Reviewed US findings, anatomy WNL. [facility] referral for echogenic intracardiac focus follow up. Reviewed red flag symptoms and when to call. RTC x 4 weeks for ROB.   ULTRASOUND REPORT  Location: [facility] Date of Service: 10/31/16  Indications:Anatomy U/S Findings:  Mason Jim intrauterine pregnancy is visualized with FHR at 165 BPM. Biometrics give an (U/S) Gestational age of 42 2/7 weeks and an (U/S) EDD of 03/25/17; this correlates with the clinically established EDD of 03/24/17.  Fetal presentation is Vertex.  EFW: 274g (10oz). Placenta: Posterior, grade 0, 3.8 cm from internal os. AFI: Adequate with MVP of 4.3 cm  Anatomic survey is complete and normal; Gender - female  . There is an echogenic intracardial focus seen in the left ventricle.  Ovaries are not seen. Survey of the adnexa demonstrates no adnexal masses. There is no free peritoneal fluid in the cul de sac.  Impression: 1. 19 2/7 week Viable Singleton Intrauterine pregnancy by U/S. 2. (U/S) EDD is consistent with Clinically established (LMP) EDD of 03/24/17. 3. echogenic intracardial focus   Gunnar Bulla, CNM

## 2016-11-03 DIAGNOSIS — O283 Abnormal ultrasonic finding on antenatal screening of mother: Secondary | ICD-10-CM | POA: Insufficient documentation

## 2016-11-03 DIAGNOSIS — D573 Sickle-cell trait: Secondary | ICD-10-CM | POA: Insufficient documentation

## 2016-11-06 ENCOUNTER — Other Ambulatory Visit: Payer: Self-pay | Admitting: Certified Nurse Midwife

## 2016-11-06 DIAGNOSIS — Z3689 Encounter for other specified antenatal screening: Secondary | ICD-10-CM

## 2016-11-06 DIAGNOSIS — O30009 Twin pregnancy, unspecified number of placenta and unspecified number of amniotic sacs, unspecified trimester: Principal | ICD-10-CM

## 2016-11-17 ENCOUNTER — Ambulatory Visit: Payer: Self-pay

## 2016-11-20 ENCOUNTER — Ambulatory Visit
Admission: RE | Admit: 2016-11-20 | Discharge: 2016-11-20 | Disposition: A | Discharge status: Home / Self Care | Payer: Medicaid Other | Source: Ambulatory Visit | Attending: Maternal & Fetal Medicine | Admitting: Maternal & Fetal Medicine

## 2016-11-20 ENCOUNTER — Other Ambulatory Visit: Payer: Self-pay | Admitting: Certified Nurse Midwife

## 2016-11-20 DIAGNOSIS — Z3492 Encounter for supervision of normal pregnancy, unspecified, second trimester: Secondary | ICD-10-CM

## 2016-11-20 DIAGNOSIS — O30009 Twin pregnancy, unspecified number of placenta and unspecified number of amniotic sacs, unspecified trimester: Secondary | ICD-10-CM

## 2016-11-20 DIAGNOSIS — Z3689 Encounter for other specified antenatal screening: Principal | ICD-10-CM

## 2016-11-28 ENCOUNTER — Encounter: Payer: Self-pay | Admitting: Certified Nurse Midwife

## 2016-11-28 ENCOUNTER — Ambulatory Visit (INDEPENDENT_AMBULATORY_CARE_PROVIDER_SITE_OTHER): Payer: Medicaid Other | Admitting: Certified Nurse Midwife

## 2016-11-28 VITALS — BP 115/69 | HR 82 | Wt 115.5 lb

## 2016-11-28 DIAGNOSIS — Z3492 Encounter for supervision of normal pregnancy, unspecified, second trimester: Secondary | ICD-10-CM

## 2016-11-28 LAB — POCT URINALYSIS DIPSTICK
Bilirubin, UA: NEGATIVE
Glucose, UA: NEGATIVE
Ketones, UA: NEGATIVE
Leukocytes, UA: NEGATIVE
Nitrite, UA: NEGATIVE
PH UA: 8 (ref 5.0–8.0)
PROTEIN UA: NEGATIVE
RBC UA: NEGATIVE
UROBILINOGEN UA: 0.2 U/dL

## 2016-11-28 NOTE — Progress Notes (Signed)
Pt presents for routine OB visit. No c/o.

## 2016-11-28 NOTE — Patient Instructions (Signed)

## 2016-11-28 NOTE — Progress Notes (Signed)
ROB doing well. Denies complaints. No LOF, Vaginal discharge or contractions. Baby is moving well. Reviewed importance of PO hydration and Gtt testing at next visit.  PTL precautions given. ROB in 4-5 wks for one hour GTT.  Doreene BurkeAnnie Jeanluc Wegman, CNM

## 2016-12-26 ENCOUNTER — Encounter: Payer: Self-pay | Admitting: Certified Nurse Midwife

## 2016-12-26 ENCOUNTER — Ambulatory Visit (INDEPENDENT_AMBULATORY_CARE_PROVIDER_SITE_OTHER): Payer: Medicaid Other | Admitting: Certified Nurse Midwife

## 2016-12-26 VITALS — BP 98/55 | HR 79 | Wt 121.2 lb

## 2016-12-26 DIAGNOSIS — Z131 Encounter for screening for diabetes mellitus: Secondary | ICD-10-CM | POA: Diagnosis not present

## 2016-12-26 DIAGNOSIS — Z23 Encounter for immunization: Secondary | ICD-10-CM | POA: Diagnosis not present

## 2016-12-26 DIAGNOSIS — Z3492 Encounter for supervision of normal pregnancy, unspecified, second trimester: Secondary | ICD-10-CM

## 2016-12-26 DIAGNOSIS — Z130 Encounter for screening for diseases of the blood and blood-forming organs and certain disorders involving the immune mechanism: Secondary | ICD-10-CM

## 2016-12-26 LAB — POCT URINALYSIS DIPSTICK
Bilirubin, UA: NEGATIVE
Glucose, UA: NEGATIVE
KETONES UA: NEGATIVE
LEUKOCYTES UA: NEGATIVE
Nitrite, UA: NEGATIVE
PH UA: 7 (ref 5.0–8.0)
RBC UA: NEGATIVE
SPEC GRAV UA: 1.015 (ref 1.010–1.025)
Urobilinogen, UA: 0.2 E.U./dL

## 2016-12-26 MED ORDER — TETANUS-DIPHTH-ACELL PERTUSSIS 5-2.5-18.5 LF-MCG/0.5 IM SUSP
0.5000 mL | Freq: Once | INTRAMUSCULAR | Status: AC
  Administered 2016-12-26: 0.5 mL via INTRAMUSCULAR

## 2016-12-26 NOTE — Patient Instructions (Signed)
Tdap Vaccine (Tetanus, Diphtheria and Pertussis): What You Need to Know 1. Why get vaccinated? Tetanus, diphtheria and pertussis are very serious diseases. Tdap vaccine can protect us from these diseases. And, Tdap vaccine given to pregnant women can protect newborn babies against pertussis. TETANUS (Lockjaw) is rare in the [place] today. It causes painful muscle tightening and stiffness, usually all over the body.  It can lead to tightening of muscles in the head and neck so you can't open your mouth, swallow, or sometimes even breathe. Tetanus kills about 1 out of 10 people who are infected even after receiving the best medical care.  DIPHTHERIA is also rare in the [place] today. It can cause a thick coating to form in the back of the throat.  It can lead to breathing problems, heart failure, paralysis, and death.  PERTUSSIS (Whooping Cough) causes severe coughing spells, which can cause difficulty breathing, vomiting and disturbed sleep.  It can also lead to weight loss, incontinence, and rib fractures. Up to 2 in 100 adolescents and 5 in 100 adults with pertussis are hospitalized or have complications, which could include pneumonia or death.  These diseases are caused by bacteria. Diphtheria and pertussis are spread from person to person through secretions from coughing or sneezing. Tetanus enters the body through cuts, scratches, or wounds. Before vaccines, as many as 200,000 cases of diphtheria, 200,000 cases of pertussis, and hundreds of cases of tetanus, were reported in the [place] each year. Since vaccination began, reports of cases for tetanus and diphtheria have dropped by about 99% and for pertussis by about 80%. 2. Tdap vaccine Tdap vaccine can protect adolescents and adults from tetanus, diphtheria, and pertussis. One dose of Tdap is routinely given at age 11 or 12. People who did not get Tdap at that age should get it as soon as possible. Tdap is especially  important for healthcare professionals and anyone having close contact with a baby younger than 12 months. Pregnant women should get a dose of Tdap during every pregnancy, to protect the newborn from pertussis. Infants are most at risk for severe, life-threatening complications from pertussis. Another vaccine, called Td, protects against tetanus and diphtheria, but not pertussis. A Td booster should be given every 10 years. Tdap may be given as one of these boosters if you have never gotten Tdap before. Tdap may also be given after a severe cut or burn to prevent tetanus infection. Your doctor or the person giving you the vaccine can give you more information. Tdap may safely be given at the same time as other vaccines. 3. Some people should not get this vaccine  A person who has ever had a life-threatening allergic reaction after a previous dose of any diphtheria, tetanus or pertussis containing vaccine, OR has a severe allergy to any part of this vaccine, should not get Tdap vaccine. Tell the person giving the vaccine about any severe allergies.  Anyone who had coma or long repeated seizures within 7 days after a childhood dose of DTP or DTaP, or a previous dose of Tdap, should not get Tdap, unless a cause other than the vaccine was found. They can still get Td.  Talk to your doctor if you: ? have seizures or another nervous system problem, ? had severe pain or swelling after any vaccine containing diphtheria, tetanus or pertussis, ? ever had a condition called Guillain-Barr Syndrome (GBS), ? aren't feeling well on the day the shot is scheduled. 4. Risks With any medicine, including   vaccines, there is a chance of side effects. These are usually mild and go away on their own. Serious reactions are also possible but are rare. Most people who get Tdap vaccine do not have any problems with it. Mild problems following Tdap: (Did not interfere with activities)  Pain where the shot was given (about  3 in 4 adolescents or 2 in 3 adults)  Redness or swelling where the shot was given (about 1 person in 5)  Mild fever of at least 100.4F (up to about 1 in 25 adolescents or 1 in 100 adults)  Headache (about 3 or 4 people in 10)  Tiredness (about 1 person in 3 or 4)  Nausea, vomiting, diarrhea, stomach ache (up to 1 in 4 adolescents or 1 in 10 adults)  Chills, sore joints (about 1 person in 10)  Body aches (about 1 person in 3 or 4)  Rash, swollen glands (uncommon)  Moderate problems following Tdap: (Interfered with activities, but did not require medical attention)  Pain where the shot was given (up to 1 in 5 or 6)  Redness or swelling where the shot was given (up to about 1 in 16 adolescents or 1 in 12 adults)  Fever over 102F (about 1 in 100 adolescents or 1 in 250 adults)  Headache (about 1 in 7 adolescents or 1 in 10 adults)  Nausea, vomiting, diarrhea, stomach ache (up to 1 or 3 people in 100)  Swelling of the entire arm where the shot was given (up to about 1 in 500).  Severe problems following Tdap: (Unable to perform usual activities; required medical attention)  Swelling, severe pain, bleeding and redness in the arm where the shot was given (rare).  Problems that could happen after any vaccine:  People sometimes faint after a medical procedure, including vaccination. Sitting or lying down for about 15 minutes can help prevent fainting, and injuries caused by a fall. Tell your doctor if you feel dizzy, or have vision changes or ringing in the ears.  Some people get severe pain in the shoulder and have difficulty moving the arm where a shot was given. This happens very rarely.  Any medication can cause a severe allergic reaction. Such reactions from a vaccine are very rare, estimated at fewer than 1 in a million doses, and would happen within a few minutes to a few hours after the vaccination. As with any medicine, there is a very remote chance of a vaccine  causing a serious injury or death. The safety of vaccines is always being monitored. For more information, visit: www.cdc.gov/vaccinesafety/ 5. What if there is a serious problem? What should I look for? Look for anything that concerns you, such as signs of a severe allergic reaction, very high fever, or unusual behavior. Signs of a severe allergic reaction can include hives, swelling of the face and throat, difficulty breathing, a fast heartbeat, dizziness, and weakness. These would usually start a few minutes to a few hours after the vaccination. What should I do?  If you think it is a severe allergic reaction or other emergency that can't wait, call 9-1-1 or get the person to the nearest hospital. Otherwise, call your doctor.  Afterward, the reaction should be reported to the Vaccine Adverse Event Reporting System (VAERS). Your doctor might file this report, or you can do it yourself through the VAERS web site at www.vaers.hhs.gov, or by calling 1-800-822-7967. ? VAERS does not give medical advice. 6. The National Vaccine Injury Compensation Program The National   Vaccine Injury Compensation Program (VICP) is a federal program that was created to compensate people who may have been injured by certain vaccines. Persons who believe they may have been injured by a vaccine can learn about the program and about filing a claim by calling 1-800-338-2382 or visiting the VICP website at www.hrsa.gov/vaccinecompensation. There is a time limit to file a claim for compensation. 7. How can I learn more?  Ask your doctor. He or she can give you the vaccine package insert or suggest other sources of information.  Call your local or state health department.  Contact the Centers for Disease Control and Prevention (CDC): ? Call 1-800-232-4636 (1-800-CDC-INFO) or ? Visit CDC's website at www.cdc.gov/vaccines CDC Tdap Vaccine VIS (09/20/13) This information is not intended to replace advice given to you by your  health care provider. Make sure you discuss any questions you have with your health care provider. Document Released: 01/13/2012 Document Revised: 04/03/2016 Document Reviewed: 04/03/2016 Elsevier Interactive Patient Education  2017 Elsevier Inc.  

## 2016-12-26 NOTE — Progress Notes (Signed)
ROB- gtt, h&h, BTC signed and faxed.

## 2016-12-26 NOTE — Progress Notes (Signed)
ROB, doing well. No complaints. Reviewed fetal movement in relation to fetal well being. Blood consent signed, TDap, and GTT today. Follow up in 2 wks.   Doreene BurkeAnnie Meleane Selinger, CNM

## 2016-12-27 LAB — HEMOGLOBIN AND HEMATOCRIT, BLOOD
HEMATOCRIT: 35.8 % (ref 34.0–46.6)
HEMOGLOBIN: 11.5 g/dL (ref 11.1–15.9)

## 2016-12-27 LAB — GLUCOSE, 1 HOUR GESTATIONAL: GESTATIONAL DIABETES SCREEN: 79 mg/dL (ref 65–139)

## 2016-12-29 ENCOUNTER — Encounter: Payer: Self-pay | Admitting: Certified Nurse Midwife

## 2017-01-02 ENCOUNTER — Encounter: Payer: Self-pay | Admitting: Certified Nurse Midwife

## 2017-01-09 ENCOUNTER — Ambulatory Visit (INDEPENDENT_AMBULATORY_CARE_PROVIDER_SITE_OTHER): Payer: Medicaid Other | Admitting: Obstetrics and Gynecology

## 2017-01-09 VITALS — BP 115/78 | HR 109 | Wt 121.1 lb

## 2017-01-09 DIAGNOSIS — Z3493 Encounter for supervision of normal pregnancy, unspecified, third trimester: Secondary | ICD-10-CM

## 2017-01-09 LAB — POCT URINALYSIS DIPSTICK
Bilirubin, UA: NEGATIVE
Blood, UA: NEGATIVE
GLUCOSE UA: NEGATIVE
KETONES UA: 5
LEUKOCYTES UA: NEGATIVE
Nitrite, UA: NEGATIVE
SPEC GRAV UA: 1.015 (ref 1.010–1.025)
UROBILINOGEN UA: 0.2 U/dL
pH, UA: 6 (ref 5.0–8.0)

## 2017-01-09 NOTE — Progress Notes (Signed)
ROB- doing well, enrolled in class;plans to breastfeed, discussed support people in labor.

## 2017-01-09 NOTE — Progress Notes (Signed)
ROB- pt is doing well 

## 2017-01-09 NOTE — Patient Instructions (Signed)
Third Trimester of Pregnancy The third trimester is from week 28 through week 40 (months 7 through 9). The third trimester is a time when the unborn baby (fetus) is growing rapidly. At the end of the ninth month, the fetus is about 20 inches in length and weighs 6-10 pounds. Body changes during your third trimester Your body will continue to go through many changes during pregnancy. The changes vary from woman to woman. During the third trimester:  Your weight will continue to increase. You can expect to gain 25-35 pounds (11-16 kg) by the end of the pregnancy.  You may begin to get stretch marks on your hips, abdomen, and breasts.  You may urinate more often because the fetus is moving lower into your pelvis and pressing on your bladder.  You may develop or continue to have heartburn. This is caused by increased hormones that slow down muscles in the digestive tract.  You may develop or continue to have constipation because increased hormones slow digestion and cause the muscles that push waste through your intestines to relax.  You may develop hemorrhoids. These are swollen veins (varicose veins) in the rectum that can itch or be painful.  You may develop swollen, bulging veins (varicose veins) in your legs.  You may have increased body aches in the pelvis, back, or thighs. This is due to weight gain and increased hormones that are relaxing your joints.  You may have changes in your hair. These can include thickening of your hair, rapid growth, and changes in texture. Some women also have hair loss during or after pregnancy, or hair that feels dry or thin. Your hair will most likely return to normal after your baby is born.  Your breasts will continue to grow and they will continue to become tender. A yellow fluid (colostrum) may leak from your breasts. This is the first milk you are producing for your baby.  Your belly button may stick out.  You may notice more swelling in your hands,  face, or ankles.  You may have increased tingling or numbness in your hands, arms, and legs. The skin on your belly may also feel numb.  You may feel short of breath because of your expanding uterus.  You may have more problems sleeping. This can be caused by the size of your belly, increased need to urinate, and an increase in your body's metabolism.  You may notice the fetus "dropping," or moving lower in your abdomen (lightening).  You may have increased vaginal discharge.  You may notice your joints feel loose and you may have pain around your pelvic bone.  What to expect at prenatal visits You will have prenatal exams every 2 weeks until week 36. Then you will have weekly prenatal exams. During a routine prenatal visit:  You will be weighed to make sure you and the baby are growing normally.  Your blood pressure will be taken.  Your abdomen will be measured to track your baby's growth.  The fetal heartbeat will be listened to.  Any test results from the previous visit will be discussed.  You may have a cervical check near your due date to see if your cervix has softened or thinned (effaced).  You will be tested for Group B streptococcus. This happens between 35 and 37 weeks.  Your health care provider may ask you:  What your birth plan is.  How you are feeling.  If you are feeling the baby move.  If you have had   any abnormal symptoms, such as leaking fluid, bleeding, severe headaches, or abdominal cramping.  If you are using any tobacco products, including cigarettes, chewing tobacco, and electronic cigarettes.  If you have any questions.  Other tests or screenings that may be performed during your third trimester include:  Blood tests that check for low iron levels (anemia).  Fetal testing to check the health, activity level, and growth of the fetus. Testing is done if you have certain medical conditions or if there are problems during the  pregnancy.  Nonstress test (NST). This test checks the health of your baby to make sure there are no signs of problems, such as the baby not getting enough oxygen. During this test, a belt is placed around your belly. The baby is made to move, and its heart rate is monitored during movement.  What is false labor? False labor is a condition in which you feel small, irregular tightenings of the muscles in the womb (contractions) that usually go away with rest, changing position, or drinking water. These are called Braxton Hicks contractions. Contractions may last for hours, days, or even weeks before true labor sets in. If contractions come at regular intervals, become more frequent, increase in intensity, or become painful, you should see your health care provider. What are the signs of labor?  Abdominal cramps.  Regular contractions that start at 10 minutes apart and become stronger and more frequent with time.  Contractions that start on the top of the uterus and spread down to the lower abdomen and back.  Increased pelvic pressure and dull back pain.  A watery or bloody mucus discharge that comes from the vagina.  Leaking of amniotic fluid. This is also known as your "water breaking." It could be a slow trickle or a gush. Let your health care provider know if it has a color or strange odor. If you have any of these signs, call your health care provider right away, even if it is before your due date. Follow these instructions at home: Medicines  Follow your health care provider's instructions regarding medicine use. Specific medicines may be either safe or unsafe to take during pregnancy.  Take a prenatal vitamin that contains at least 600 micrograms (mcg) of folic acid.  If you develop constipation, try taking a stool softener if your health care provider approves. Eating and drinking  Eat a balanced diet that includes fresh fruits and vegetables, whole grains, good sources of protein  such as meat, eggs, or tofu, and low-fat dairy. Your health care provider will help you determine the amount of weight gain that is right for you.  Avoid raw meat and uncooked cheese. These carry germs that can cause birth defects in the baby.  If you have low calcium intake from food, talk to your health care provider about whether you should take a daily calcium supplement.  Eat four or five small meals rather than three large meals a day.  Limit foods that are high in fat and processed sugars, such as fried and sweet foods.  To prevent constipation: ? Drink enough fluid to keep your urine clear or pale yellow. ? Eat foods that are high in fiber, such as fresh fruits and vegetables, whole grains, and beans. Activity  Exercise only as directed by your health care provider. Most women can continue their usual exercise routine during pregnancy. Try to exercise for 30 minutes at least 5 days a week. Stop exercising if you experience uterine contractions.  Avoid heavy   lifting.  Do not exercise in extreme heat or humidity, or at high altitudes.  Wear low-heel, comfortable shoes.  Practice good posture.  You may continue to have sex unless your health care provider tells you otherwise. Relieving pain and discomfort  Take frequent breaks and rest with your legs elevated if you have leg cramps or low back pain.  Take warm sitz baths to soothe any pain or discomfort caused by hemorrhoids. Use hemorrhoid cream if your health care provider approves.  Wear a good support bra to prevent discomfort from breast tenderness.  If you develop varicose veins: ? Wear support pantyhose or compression stockings as told by your healthcare provider. ? Elevate your feet for 15 minutes, 3-4 times a day. Prenatal care  Write down your questions. Take them to your prenatal visits.  Keep all your prenatal visits as told by your health care provider. This is important. Safety  Wear your seat belt at  all times when driving.  Make a list of emergency phone numbers, including numbers for family, friends, the hospital, and police and fire departments. General instructions  Avoid cat litter boxes and soil used by cats. These carry germs that can cause birth defects in the baby. If you have a cat, ask someone to clean the litter box for you.  Do not travel far distances unless it is absolutely necessary and only with the approval of your health care provider.  Do not use hot tubs, steam rooms, or saunas.  Do not drink alcohol.  Do not use any products that contain nicotine or tobacco, such as cigarettes and e-cigarettes. If you need help quitting, ask your health care provider.  Do not use any medicinal herbs or unprescribed drugs. These chemicals affect the formation and growth of the baby.  Do not douche or use tampons or scented sanitary pads.  Do not cross your legs for long periods of time.  To prepare for the arrival of your baby: ? Take prenatal classes to understand, practice, and ask questions about labor and delivery. ? Make a trial run to the hospital. ? Visit the hospital and tour the maternity area. ? Arrange for maternity or paternity leave through employers. ? Arrange for family and friends to take care of pets while you are in the hospital. ? Purchase a rear-facing car seat and make sure you know how to install it in your car. ? Pack your hospital bag. ? Prepare the baby's nursery. Make sure to remove all pillows and stuffed animals from the baby's crib to prevent suffocation.  Visit your dentist if you have not gone during your pregnancy. Use a soft toothbrush to brush your teeth and be gentle when you floss. Contact a health care provider if:  You are unsure if you are in labor or if your water has broken.  You become dizzy.  You have mild pelvic cramps, pelvic pressure, or nagging pain in your abdominal area.  You have lower back pain.  You have persistent  nausea, vomiting, or diarrhea.  You have an unusual or bad smelling vaginal discharge.  You have pain when you urinate. Get help right away if:  Your water breaks before 37 weeks.  You have regular contractions less than 5 minutes apart before 37 weeks.  You have a fever.  You are leaking fluid from your vagina.  You have spotting or bleeding from your vagina.  You have severe abdominal pain or cramping.  You have rapid weight loss or weight gain.    You have shortness of breath with chest pain.  You notice sudden or extreme swelling of your face, hands, ankles, feet, or legs.  Your baby makes fewer than 10 movements in 2 hours.  You have severe headaches that do not go away when you take medicine.  You have vision changes. Summary  The third trimester is from week 28 through week 40, months 7 through 9. The third trimester is a time when the unborn baby (fetus) is growing rapidly.  During the third trimester, your discomfort may increase as you and your baby continue to gain weight. You may have abdominal, leg, and back pain, sleeping problems, and an increased need to urinate.  During the third trimester your breasts will keep growing and they will continue to become tender. A yellow fluid (colostrum) may leak from your breasts. This is the first milk you are producing for your baby.  False labor is a condition in which you feel small, irregular tightenings of the muscles in the womb (contractions) that eventually go away. These are called Braxton Hicks contractions. Contractions may last for hours, days, or even weeks before true labor sets in.  Signs of labor can include: abdominal cramps; regular contractions that start at 10 minutes apart and become stronger and more frequent with time; watery or bloody mucus discharge that comes from the vagina; increased pelvic pressure and dull back pain; and leaking of amniotic fluid. This information is not intended to replace advice  given to you by your health care provider. Make sure you discuss any questions you have with your health care provider. Document Released: 07/08/2001 Document Revised: 12/20/2015 Document Reviewed: 09/14/2012 Elsevier Interactive Patient Education  2017 [employer]  

## 2017-01-23 ENCOUNTER — Encounter: Payer: Self-pay | Admitting: Certified Nurse Midwife

## 2017-01-23 ENCOUNTER — Ambulatory Visit (INDEPENDENT_AMBULATORY_CARE_PROVIDER_SITE_OTHER): Payer: Medicaid Other | Admitting: Certified Nurse Midwife

## 2017-01-23 VITALS — BP 113/68 | HR 74 | Wt 126.2 lb

## 2017-01-23 DIAGNOSIS — Z3493 Encounter for supervision of normal pregnancy, unspecified, third trimester: Secondary | ICD-10-CM

## 2017-01-23 LAB — POCT URINALYSIS DIPSTICK
BILIRUBIN UA: NEGATIVE
Blood, UA: NEGATIVE
Glucose, UA: NEGATIVE
KETONES UA: NEGATIVE
LEUKOCYTES UA: NEGATIVE
NITRITE UA: NEGATIVE
PH UA: 6 (ref 5.0–8.0)
Protein, UA: NEGATIVE
Spec Grav, UA: 1.01 (ref 1.010–1.025)
Urobilinogen, UA: 0.2 E.U./dL

## 2017-01-23 NOTE — Progress Notes (Signed)
Pt is here for an OB visit. 

## 2017-01-23 NOTE — Progress Notes (Signed)
ROB, doing well. No complaints. Discussed fetal movement. Denies LOF, vag bleeding and contractions. Follow up in 2 weeks.   Doreene BurkeAnnie Amilee Janvier, CNM

## 2017-01-23 NOTE — Patient Instructions (Signed)

## 2017-02-06 ENCOUNTER — Encounter: Payer: Self-pay | Admitting: Certified Nurse Midwife

## 2017-02-06 ENCOUNTER — Ambulatory Visit (INDEPENDENT_AMBULATORY_CARE_PROVIDER_SITE_OTHER): Payer: Medicaid Other | Admitting: Certified Nurse Midwife

## 2017-02-06 VITALS — BP 113/66 | HR 85 | Wt 129.0 lb

## 2017-02-06 DIAGNOSIS — Z3493 Encounter for supervision of normal pregnancy, unspecified, third trimester: Secondary | ICD-10-CM

## 2017-02-06 LAB — POCT URINALYSIS DIPSTICK
BILIRUBIN UA: NEGATIVE
Blood, UA: NEGATIVE
Glucose, UA: NEGATIVE
KETONES UA: NEGATIVE
LEUKOCYTES UA: NEGATIVE
Nitrite, UA: NEGATIVE
Spec Grav, UA: 1.015 (ref 1.010–1.025)
Urobilinogen, UA: 0.2 E.U./dL
pH, UA: 6.5 (ref 5.0–8.0)

## 2017-02-06 NOTE — Progress Notes (Signed)
ROB-Pt doing well, reports incomplete emptying of bladder. Discussed fetal positioning and home treatment measures. [facility]our[facility] patient [facility] Discussed initiating HSV prophylaxis at 36 weeks. Reviewed red flag symptoms and when to call. RTC x 2 weeks for ROB or sooner if needed.

## 2017-02-06 NOTE — Progress Notes (Signed)
ROB- incomplete empty of bladder.

## 2017-02-06 NOTE — Patient Instructions (Signed)
Vaginal Delivery Vaginal delivery means that you will give birth by pushing your baby out of your birth canal (vagina). A team of health care providers will help you before, during, and after vaginal delivery. Birth experiences are unique for every woman and every pregnancy, and birth experiences vary depending on where you choose to give birth. What should I do to prepare for my baby's birth? Before your baby is born, it is important to talk with your health care provider about:  Your labor and delivery preferences. These may include: ? Medicines that you may be given. ? How you will manage your pain. This might include non-medical pain relief techniques or injectable pain relief such as epidural analgesia. ? How you and your baby will be monitored during labor and delivery. ? Who may be in the labor and delivery room with you. ? Your feelings about surgical delivery of your baby (cesarean delivery, or C-section) if this becomes necessary. ? Your feelings about receiving donated blood through an IV tube (blood transfusion) if this becomes necessary.  Whether you are able: ? To take pictures or videos of the birth. ? To eat during labor and delivery. ? To move around, walk, or change positions during labor and delivery.  What to expect after your baby is born, such as: ? Whether delayed umbilical cord clamping and cutting is offered. ? Who will care for your baby right after birth. ? Medicines or tests that may be recommended for your baby. ? Whether breastfeeding is supported in your hospital or birth center. ? How long you will be in the hospital or birth center.  How any medical conditions you have may affect your baby or your labor and delivery experience.  To prepare for your baby's birth, you should also:  Attend all of your health care visits before delivery (prenatal visits) as recommended by your health care provider. This is important.  Prepare your home for your baby's  arrival. Make sure that you have: ? Diapers. ? Baby clothing. ? Feeding equipment. ? Safe sleeping arrangements for you and your baby.  Install a car seat in your vehicle. Have your car seat checked by a certified car seat installer to make sure that it is installed safely.  Think about who will help you with your new baby at home for at least the first several weeks after delivery.  What can I expect when I arrive at the birth center or hospital? Once you are in labor and have been admitted into the hospital or birth center, your health care provider may:  Review your pregnancy history and any concerns you have.  Insert an IV tube into one of your veins. This is used to give you fluids and medicines.  Check your blood pressure, pulse, temperature, and heart rate (vital signs).  Check whether your bag of water (amniotic sac) has broken (ruptured).  Talk with you about your birth plan and discuss pain control options.  Monitoring Your health care provider may monitor your contractions (uterine monitoring) and your baby's heart rate (fetal monitoring). You may need to be monitored:  Often, but not continuously (intermittently).  All the time or for long periods at a time (continuously). Continuous monitoring may be needed if: ? You are taking certain medicines, such as medicine to relieve pain or make your contractions stronger. ? You have pregnancy or labor complications.  Monitoring may be done by:  Placing a special stethoscope or a handheld monitoring device on your abdomen to   check your baby's heartbeat, and feeling your abdomen for contractions. This method of monitoring does not continuously record your baby's heartbeat or your contractions.  Placing monitors on your abdomen (external monitors) to record your baby's heartbeat and the frequency and length of contractions. You may not have to wear external monitors all the time.  Placing monitors inside of your uterus  (internal monitors) to record your baby's heartbeat and the frequency, length, and strength of your contractions. ? Your health care provider may use internal monitors if he or she needs more information about the strength of your contractions or your baby's heart rate. ? Internal monitors are put in place by passing a thin, flexible wire through your vagina and into your uterus. Depending on the type of monitor, it may remain in your uterus or on your baby's head until birth. ? Your health care provider will discuss the benefits and risks of internal monitoring with you and will ask for your permission before inserting the monitors.  Telemetry. This is a type of continuous monitoring that can be done with external or internal monitors. Instead of having to stay in bed, you are able to move around during telemetry. Ask your health care provider if telemetry is an option for you.  Physical exam Your health care provider may perform a physical exam. This may include:  Checking whether your baby is positioned: ? With the head toward your vagina (head-down). This is most common. ? With the head toward the top of your uterus (head-up or breech). If your baby is in a breech position, your health care provider may try to turn your baby to a head-down position so you can deliver vaginally. If it does not seem that your baby can be born vaginally, your provider may recommend surgery to deliver your baby. In rare cases, you may be able to deliver vaginally if your baby is head-up (breech delivery). ? Lying sideways (transverse). Babies that are lying sideways cannot be delivered vaginally.  Checking your cervix to determine: ? Whether it is thinning out (effacing). ? Whether it is opening up (dilating). ? How low your baby has moved into your birth canal.  What are the three stages of labor and delivery?  Normal labor and delivery is divided into the following three stages: Stage 1  Stage 1 is the  longest stage of labor, and it can last for hours or days. Stage 1 includes: ? Early labor. This is when contractions may be irregular, or regular and mild. Generally, early labor contractions are more than 10 minutes apart. ? Active labor. This is when contractions get longer, more regular, more frequent, and more intense. ? The transition phase. This is when contractions happen very close together, are very intense, and may last longer than during any other part of labor.  Contractions generally feel mild, infrequent, and irregular at first. They get stronger, more frequent (about every 2-3 minutes), and more regular as you progress from early labor through active labor and transition.  Many women progress through stage 1 naturally, but you may need help to continue making progress. If this happens, your health care provider may talk with you about: ? Rupturing your amniotic sac if it has not ruptured yet. ? Giving you medicine to help make your contractions stronger and more frequent.  Stage 1 ends when your cervix is completely dilated to 4 inches (10 cm) and completely effaced. This happens at the end of the transition phase. Stage 2  Once   your cervix is completely effaced and dilated to 4 inches (10 cm), you may start to feel an urge to push. It is common for the body to naturally take a rest before feeling the urge to push, especially if you received an epidural or certain other pain medicines. This rest period may last for up to 1-2 hours, depending on your unique labor experience.  During stage 2, contractions are generally less painful, because pushing helps relieve contraction pain. Instead of contraction pain, you may feel stretching and burning pain, especially when the widest part of your baby's head passes through the vaginal opening (crowning).  Your health care provider will closely monitor your pushing progress and your baby's progress through the vagina during stage 2.  Your  health care provider may massage the area of skin between your vaginal opening and anus (perineum) or apply warm compresses to your perineum. This helps it stretch as the baby's head starts to crown, which can help prevent perineal tearing. ? In some cases, an incision may be made in your perineum (episiotomy) to allow the baby to pass through the vaginal opening. An episiotomy helps to make the opening of the vagina larger to allow more room for the baby to fit through.  It is very important to breathe and focus so your health care provider can control the delivery of your baby's head. Your health care provider may have you decrease the intensity of your pushing, to help prevent perineal tearing.  After delivery of your baby's head, the shoulders and the rest of the body generally deliver very quickly and without difficulty.  Once your baby is delivered, the umbilical cord may be cut right away, or this may be delayed for 1-2 minutes, depending on your baby's health. This may vary among health care providers, hospitals, and birth centers.  If you and your baby are healthy enough, your baby may be placed on your chest or abdomen to help maintain the baby's temperature and to help you bond with each other. Some mothers and babies start breastfeeding at this time. Your health care team will dry your baby and help keep your baby warm during this time.  Your baby may need immediate care if he or she: ? Showed signs of distress during labor. ? Has a medical condition. ? Was born too early (prematurely). ? Had a bowel movement before birth (meconium). ? Shows signs of difficulty transitioning from being inside the uterus to being outside of the uterus. If you are planning to breastfeed, your health care team will help you begin a feeding. Stage 3  The third stage of labor starts immediately after the birth of your baby and ends after you deliver the placenta. The placenta is an organ that develops  during pregnancy to provide oxygen and nutrients to your baby in the womb.  Delivering the placenta may require some pushing, and you may have mild contractions. Breastfeeding can stimulate contractions to help you deliver the placenta.  After the placenta is delivered, your uterus should tighten (contract) and become firm. This helps to stop bleeding in your uterus. To help your uterus contract and to control bleeding, your health care provider may: ? Give you medicine by injection, through an IV tube, by mouth, or through your rectum (rectally). ? Massage your abdomen or perform a vaginal exam to remove any blood clots that are left in your uterus. ? Empty your bladder by placing a thin, flexible tube (catheter) into your bladder. ? Encourage   you to breastfeed your baby. After labor is over, you and your baby will be monitored closely to ensure that you are both healthy until you are ready to go home. Your health care team will teach you how to care for yourself and your baby. This information is not intended to replace advice given to you by your health care provider. Make sure you discuss any questions you have with your health care provider. Document Released: 04/22/2008 Document Revised: 02/01/2016 Document Reviewed: 07/29/2015 [employer] Interactive Patient Education  2018 [employer]  

## 2017-02-20 ENCOUNTER — Ambulatory Visit (INDEPENDENT_AMBULATORY_CARE_PROVIDER_SITE_OTHER): Payer: Medicaid Other | Admitting: Certified Nurse Midwife

## 2017-02-20 VITALS — BP 124/82 | HR 94 | Wt 137.7 lb

## 2017-02-20 DIAGNOSIS — Z3403 Encounter for supervision of normal first pregnancy, third trimester: Secondary | ICD-10-CM

## 2017-02-20 LAB — POCT URINALYSIS DIPSTICK
Bilirubin, UA: NEGATIVE
Glucose, UA: NEGATIVE
Ketones, UA: NEGATIVE
NITRITE UA: NEGATIVE
PH UA: 6 (ref 5.0–8.0)
RBC UA: NEGATIVE
Spec Grav, UA: 1.02 (ref 1.010–1.025)
UROBILINOGEN UA: 0.2 U/dL

## 2017-02-20 NOTE — Patient Instructions (Addendum)
Braxton Hicks Contractions Contractions of the uterus can occur throughout pregnancy, but they are not always a sign that you are in labor. You may have practice contractions called Braxton Hicks contractions. These false labor contractions are sometimes confused with true labor. What are Braxton Hicks contractions? Braxton Hicks contractions are tightening movements that occur in the muscles of the uterus before labor. Unlike true labor contractions, these contractions do not result in opening (dilation) and thinning of the cervix. Toward the end of pregnancy (32-34 weeks), Braxton Hicks contractions can happen more often and may become stronger. These contractions are sometimes difficult to tell apart from true labor because they can be very uncomfortable. You should not feel embarrassed if you go to the hospital with false labor. Sometimes, the only way to tell if you are in true labor is for your health care provider to look for changes in the cervix. The health care provider will do a physical exam and may monitor your contractions. If you are not in true labor, the exam should show that your cervix is not dilating and your water has not broken. If there are no prenatal problems or other health problems associated with your pregnancy, it is completely safe for you to be sent home with false labor. You may continue to have Braxton Hicks contractions until you go into true labor. How can I tell the difference between true labor and false labor?  Differences ? False labor ? Contractions last 30-70 seconds.: Contractions are usually shorter and not as strong as true labor contractions. ? Contractions become very regular.: Contractions are usually irregular. ? Discomfort is usually felt in the top of the uterus, and it spreads to the lower abdomen and low back.: Contractions are often felt in the front of the lower abdomen and in the groin. ? Contractions do not go away with walking.: Contractions may  go away when you walk around or change positions while lying down. ? Contractions usually become more intense and increase in frequency.: Contractions get weaker and are shorter-lasting as time goes on. ? The cervix dilates and gets thinner.: The cervix usually does not dilate or become thin. Follow these instructions at home:  Take over-the-counter and prescription medicines only as told by your health care provider.  Keep up with your usual exercises and follow other instructions from your health care provider.  Eat and drink lightly if you think you are going into labor.  If Braxton Hicks contractions are making you uncomfortable: ? Change your position from lying down or resting to walking, or change from walking to resting. ? Sit and rest in a tub of warm water. ? Drink enough fluid to keep your urine clear or pale yellow. Dehydration may cause these contractions. ? Do slow and deep breathing several times an hour.  Keep all follow-up prenatal visits as told by your health care provider. This is important. Contact a health care provider if:  You have a fever.  You have continuous pain in your abdomen. Get help right away if:  Your contractions become stronger, more regular, and closer together.  You have fluid leaking or gushing from your vagina.  You pass blood-tinged mucus (bloody show).  You have bleeding from your vagina.  You have low back pain that you never had before.  You feel your baby's head pushing down and causing pelvic pressure.  Your baby is not moving inside you as much as it used to. Summary  Contractions that occur before labor are   called Braxton Hicks contractions, false labor, or practice contractions.  Braxton Hicks contractions are usually shorter, weaker, farther apart, and less regular than true labor contractions. True labor contractions usually become progressively stronger and regular and they become more frequent.  Manage discomfort from  [facility] contractions by changing position, resting in a warm bath, drinking plenty of water, or practicing deep breathing. This information is not intended to replace advice given to you by your health care provider. Make sure you discuss any questions you have with your health care provider. Document Released: 07/14/2005 Document Revised: 06/02/2016 Document Reviewed: 06/02/2016 Elsevier Interactive Patient Education  2017 Elsevier Inc.  [employer] CorporationBraxton Hicks Contractions Contractions of the uterus can occur throughout pregnancy, but they are not always a sign that you are in labor. You may have practice contractions called Braxton Hicks contractions. These false labor contractions are sometimes confused with true labor. What are Deberah PeltonBraxton Hicks contractions? Braxton Hicks contractions are tightening movements that occur in the muscles of the uterus before labor. Unlike true labor contractions, these contractions do not result in opening (dilation) and thinning of the cervix. Toward the end of pregnancy (32-34 weeks), Braxton Hicks contractions can happen more often and may become stronger. These contractions are sometimes difficult to tell apart from true labor because they can be very uncomfortable. You should not feel embarrassed if you go to the hospital with false labor. Sometimes, the only way to tell if you are in true labor is for your health care provider to look for changes in the cervix. The health care provider will do a physical exam and may monitor your contractions. If you are not in true labor, the exam should show that your cervix is not dilating and your water has not broken. If there are no prenatal problems or other health problems associated with your pregnancy, it is completely safe for you to be sent home with false labor. You may continue to have Braxton Hicks contractions until you go into true labor. How can I tell the difference between true labor and false  labor?  Differences ? False labor ? Contractions last 30-70 seconds.: Contractions are usually shorter and not as strong as true labor contractions. ? Contractions become very regular.: Contractions are usually irregular. ? Discomfort is usually felt in the top of the uterus, and it spreads to the lower abdomen and low back.: Contractions are often felt in the front of the lower abdomen and in the groin. ? Contractions do not go away with walking.: Contractions may go away when you walk around or change positions while lying down. ? Contractions usually become more intense and increase in frequency.: Contractions get weaker and are shorter-lasting as time goes on. ? The cervix dilates and gets thinner.: The cervix usually does not dilate or become thin. Follow these instructions at home:  Take over-the-counter and prescription medicines only as told by your health care provider.  Keep up with your usual exercises and follow other instructions from your health care provider.  Eat and drink lightly if you think you are going into labor.  If Braxton Hicks contractions are making you uncomfortable: ? Change your position from lying down or resting to walking, or change from walking to resting. ? Sit and rest in a tub of warm water. ? Drink enough fluid to keep your urine clear or pale yellow. Dehydration may cause these contractions. ? Do slow and deep breathing several times an hour.  Keep all follow-up prenatal visits as told  by your health care provider. This is important. Contact a health care provider if:  You have a fever.  You have continuous pain in your abdomen. Get help right away if:  Your contractions become stronger, more regular, and closer together.  You have fluid leaking or gushing from your vagina.  You pass blood-tinged mucus (bloody show).  You have bleeding from your vagina.  You have low back pain that you never had before.  You feel your baby's head  pushing down and causing pelvic pressure.  Your baby is not moving inside you as much as it used to. Summary  Contractions that occur before labor are called Braxton Hicks contractions, false labor, or practice contractions.  Braxton Hicks contractions are usually shorter, weaker, farther apart, and less regular than true labor contractions. True labor contractions usually become progressively stronger and regular and they become more frequent.  Manage discomfort from [facility] contractions by changing position, resting in a warm bath, drinking plenty of water, or practicing deep breathing. This information is not intended to replace advice given to you by your health care provider. Make sure you discuss any questions you have with your health care provider. Document Released: 07/14/2005 Document Revised: 06/02/2016 Document Reviewed: 06/02/2016 Elsevier Interactive Patient Education  2017 [employer]

## 2017-02-20 NOTE — Progress Notes (Signed)
ROB, doing well. No complaints 2+ pitting edema in her feet. Denies any other symptoms, endorses good fetal movement. Discussed GBS at next visit and PTL precautions .  Follow up  1 wk,  Doreene BurkeAnnie Saadia Dewitt, CNM

## 2017-02-27 ENCOUNTER — Ambulatory Visit (INDEPENDENT_AMBULATORY_CARE_PROVIDER_SITE_OTHER): Payer: Medicaid Other | Admitting: Obstetrics and Gynecology

## 2017-02-27 VITALS — BP 111/76 | HR 76 | Wt 139.2 lb

## 2017-02-27 DIAGNOSIS — Z113 Encounter for screening for infections with a predominantly sexual mode of transmission: Secondary | ICD-10-CM

## 2017-02-27 DIAGNOSIS — Z3685 Encounter for antenatal screening for Streptococcus B: Secondary | ICD-10-CM

## 2017-02-27 DIAGNOSIS — Z3493 Encounter for supervision of normal pregnancy, unspecified, third trimester: Secondary | ICD-10-CM

## 2017-02-27 LAB — POCT URINALYSIS DIPSTICK
BILIRUBIN UA: NEGATIVE
Blood, UA: NEGATIVE
Glucose, UA: NEGATIVE
Ketones, UA: NEGATIVE
Leukocytes, UA: NEGATIVE
SPEC GRAV UA: 1.01 (ref 1.010–1.025)
Urobilinogen, UA: 0.2 E.U./dL
pH, UA: 6 (ref 5.0–8.0)

## 2017-02-27 NOTE — Progress Notes (Signed)
ROB- cultures obtained, pt is having some B feet swelling, is going to start propping her feet up at night

## 2017-02-27 NOTE — Progress Notes (Signed)
ROB & cultures-doing well, feels tired again;cultures obtained, labor precautions discussed. Plans depo & bottle feeding. Discussed volunteer Doula, labor pain management options.

## 2017-03-01 LAB — GC/CHLAMYDIA PROBE AMP
Chlamydia trachomatis, NAA: NEGATIVE
NEISSERIA GONORRHOEAE BY PCR: NEGATIVE

## 2017-03-01 LAB — STREP GP B NAA: Strep Gp B NAA: NEGATIVE

## 2017-03-06 ENCOUNTER — Encounter: Payer: Medicaid Other | Admitting: Certified Nurse Midwife

## 2017-03-09 ENCOUNTER — Ambulatory Visit (INDEPENDENT_AMBULATORY_CARE_PROVIDER_SITE_OTHER): Payer: Medicaid Other | Admitting: Certified Nurse Midwife

## 2017-03-09 ENCOUNTER — Encounter: Payer: Self-pay | Admitting: Certified Nurse Midwife

## 2017-03-09 VITALS — BP 119/68 | HR 77 | Wt 144.4 lb

## 2017-03-09 DIAGNOSIS — Z3493 Encounter for supervision of normal pregnancy, unspecified, third trimester: Secondary | ICD-10-CM

## 2017-03-09 LAB — POCT URINALYSIS DIPSTICK
BILIRUBIN UA: NEGATIVE
Blood, UA: NEGATIVE
GLUCOSE UA: NEGATIVE
KETONES UA: NEGATIVE
LEUKOCYTES UA: NEGATIVE
NITRITE UA: NEGATIVE
PH UA: 5 (ref 5.0–8.0)
Protein, UA: NEGATIVE
Spec Grav, UA: 1.015 (ref 1.010–1.025)
Urobilinogen, UA: 0.2 E.U./dL

## 2017-03-09 NOTE — Progress Notes (Signed)
Pt is here for a routine OB visit. 

## 2017-03-12 ENCOUNTER — Encounter: Payer: Self-pay | Admitting: Certified Nurse Midwife

## 2017-03-12 NOTE — Progress Notes (Signed)
ROB-Pt doing well, reports increased pelvic pressure and irregular contractions. Declines cervical exam. Discussed use of abdominal support and compression stockings. Reviewed red flag symptoms and when to call. RTC x 1 week or sooner if needed.

## 2017-03-18 ENCOUNTER — Ambulatory Visit (INDEPENDENT_AMBULATORY_CARE_PROVIDER_SITE_OTHER): Payer: Medicaid Other | Admitting: Certified Nurse Midwife

## 2017-03-18 VITALS — BP 121/80 | HR 75 | Wt 144.2 lb

## 2017-03-18 DIAGNOSIS — Z3493 Encounter for supervision of normal pregnancy, unspecified, third trimester: Secondary | ICD-10-CM

## 2017-03-18 LAB — POCT URINALYSIS DIPSTICK
BILIRUBIN UA: NEGATIVE
Blood, UA: NEGATIVE
GLUCOSE UA: NEGATIVE
KETONES UA: NEGATIVE
LEUKOCYTES UA: NEGATIVE
Nitrite, UA: NEGATIVE
PH UA: 6 (ref 5.0–8.0)
Spec Grav, UA: 1.02 (ref 1.010–1.025)
Urobilinogen, UA: 0.2 E.U./dL

## 2017-03-18 MED ORDER — VALACYCLOVIR HCL 500 MG PO TABS: 500.0000 mg | ORAL_TABLET | Freq: Every day | ORAL | 1 refills | Status: AC

## 2017-03-18 NOTE — Progress Notes (Signed)
ROB, doing well. No complaints discussed additonal testing at 40 wks and scheduling induction @ 41 wks. She agrees to plan. Labor precautions reviewed. Follow 1 wk with Pattricia Boss. She has been taking valtrex ( already had some at home) . Order placed to pharmacy for suppressive therapy.   Doreene Burke, CNM

## 2017-03-18 NOTE — Patient Instructions (Signed)

## 2017-03-20 ENCOUNTER — Other Ambulatory Visit: Payer: Self-pay | Admitting: Certified Nurse Midwife

## 2017-03-20 DIAGNOSIS — Z369 Encounter for antenatal screening, unspecified: Secondary | ICD-10-CM

## 2017-03-24 ENCOUNTER — Encounter: Payer: Self-pay | Admitting: Certified Nurse Midwife

## 2017-03-24 ENCOUNTER — Ambulatory Visit (INDEPENDENT_AMBULATORY_CARE_PROVIDER_SITE_OTHER): Payer: Medicaid Other | Admitting: Certified Nurse Midwife

## 2017-03-24 ENCOUNTER — Other Ambulatory Visit: Payer: Medicaid Other

## 2017-03-24 VITALS — BP 114/78 | HR 70 | Wt 146.4 lb

## 2017-03-24 DIAGNOSIS — Z3493 Encounter for supervision of normal pregnancy, unspecified, third trimester: Secondary | ICD-10-CM

## 2017-03-24 LAB — POCT URINALYSIS DIPSTICK
Bilirubin, UA: NEGATIVE
GLUCOSE UA: NEGATIVE
Ketones, UA: NEGATIVE
Leukocytes, UA: NEGATIVE
NITRITE UA: NEGATIVE
PH UA: 6.5 (ref 5.0–8.0)
Protein, UA: NEGATIVE
RBC UA: NEGATIVE
SPEC GRAV UA: 1.01 (ref 1.010–1.025)
UROBILINOGEN UA: 0.2 U/dL

## 2017-03-24 NOTE — Progress Notes (Signed)
ROB-NST performed today was reviewed and was found to be reactive. Baseline 130 bpm with Moderate variability; No decels noted. Continue recommended antenatal testing and prenatal care. Reviewed red flag symptoms and when to call. RTC x Friday for BPP as previously scheduled. Will schedule IOL for 41 weeks and contact patient with date and time.

## 2017-03-24 NOTE — Patient Instructions (Signed)
Fetal Movement Counts Patient Name: ________________________________________________ Patient Due Date: ____________________ What is a fetal movement count? A fetal movement count is the number of times that you feel your baby move during a certain amount of time. This may also be called a fetal kick count. A fetal movement count is recommended for every pregnant woman. You may be asked to start counting fetal movements as early as week 28 of your pregnancy. Pay attention to when your baby is most active. You may notice your baby's sleep and wake cycles. You may also notice things that make your baby move more. You should do a fetal movement count:  When your baby is normally most active.  At the same time each day.  A good time to count movements is while you are resting, after having something to eat and drink. How do I count fetal movements? 1. Find a quiet, comfortable area. Sit, or lie down on your side. 2. Write down the date, the start time and stop time, and the number of movements that you felt between those two times. Take this information with you to your health care visits. 3. For 2 hours, count kicks, flutters, swishes, rolls, and jabs. You should feel at least 10 movements during 2 hours. 4. You may stop counting after you have felt 10 movements. 5. If you do not feel 10 movements in 2 hours, have something to eat and drink. Then, keep resting and counting for 1 hour. If you feel at least 4 movements during that hour, you may stop counting. Contact a health care provider if:  You feel fewer than 4 movements in 2 hours.  Your baby is not moving like he or she usually does. Date: ____________ Start time: ____________ Stop time: ____________ Movements: ____________ Date: ____________ Start time: ____________ Stop time: ____________ Movements: ____________ Date: ____________ Start time: ____________ Stop time: ____________ Movements: ____________ Date: ____________ Start time:  ____________ Stop time: ____________ Movements: ____________ Date: ____________ Start time: ____________ Stop time: ____________ Movements: ____________ Date: ____________ Start time: ____________ Stop time: ____________ Movements: ____________ Date: ____________ Start time: ____________ Stop time: ____________ Movements: ____________ Date: ____________ Start time: ____________ Stop time: ____________ Movements: ____________ Date: ____________ Start time: ____________ Stop time: ____________ Movements: ____________ This information is not intended to replace advice given to you by your health care provider. Make sure you discuss any questions you have with your health care provider. Document Released: 08/13/2006 Document Revised: 03/12/2016 Document Reviewed: 08/23/2015 Elsevier Interactive Patient Education  2018 Elsevier Inc. Nonstress Test The nonstress test is a procedure that monitors the fetus's heartbeat. The test will monitor the heartbeat when the fetus is at rest and while the fetus is moving. In a healthy fetus, there will be an increase in fetal heart rate when the fetus moves or kicks. The heart rate will decrease at rest. This test helps determine if the fetus is healthy. Your health care provider will look at a number of patterns in the heart rate tracing to make sure your baby is thriving. If there is concern, your health care provider may order additional tests or may suggest another course of action. This test is often done in the third trimester and can help determine if an early delivery is needed and safe. Common reasons to have this test are:  You are past your due date.  You have a high-risk pregnancy.  You are feeling less movement than normal.  You have lost a pregnancy in the past.  Your health   care provider suspects fetal growth problems.  You have too much or too little amniotic fluid.  What happens before the procedure?  Eat a meal right before the test or as  directed by your health care provider. Food may help stimulate fetal movements.  Use the restroom right before the test. What happens during the procedure?  Two belts will be placed around your abdomen. These belts have monitors attached to them. One records the fetal heart rate and the other records uterine contractions.  You may be asked to lie down on your side or to stay sitting upright.  You may be given a button to press when you feel movement.  The fetal heartbeat is listened to and watched on a screen. The heartbeat is recorded on a sheet of paper.  If the fetus seems to be sleeping, you may be asked to drink some juice or soda, gently press your abdomen, or make some noise to wake the fetus. What happens after the procedure? Your health care provider will discuss the test results with you and make recommendations for the near future.  This information is not intended to replace advice given to you by your health care provider. Make sure you discuss any questions you have with your health care provider. This information is not intended to replace advice given to you by your health care provider. Make sure you discuss any questions you have with your health care provider. Document Released: 07/04/2002 Document Revised: 06/13/2016 Document Reviewed: 08/17/2012 Elsevier Interactive Patient Education  2018 Elsevier Inc.  

## 2017-03-25 ENCOUNTER — Encounter: Payer: Self-pay | Admitting: Certified Nurse Midwife

## 2017-03-26 NOTE — Telephone Encounter (Signed)
To [employer]

## 2017-03-27 ENCOUNTER — Ambulatory Visit (INDEPENDENT_AMBULATORY_CARE_PROVIDER_SITE_OTHER): Payer: Medicaid Other

## 2017-03-27 DIAGNOSIS — Z3493 Encounter for supervision of normal pregnancy, unspecified, third trimester: Secondary | ICD-10-CM

## 2017-03-27 DIAGNOSIS — Z369 Encounter for antenatal screening, unspecified: Secondary | ICD-10-CM | POA: Diagnosis not present

## 2017-03-28 ENCOUNTER — Encounter: Payer: Self-pay | Admitting: *Deleted

## 2017-03-28 ENCOUNTER — Observation Stay
Admission: EM | Admit: 2017-03-28 | Discharge: 2017-03-28 | Disposition: A | Discharge status: Home / Self Care | Payer: Medicaid Other | Source: Home / Self Care | Admitting: Obstetrics and Gynecology

## 2017-03-28 DIAGNOSIS — Z3403 Encounter for supervision of normal first pregnancy, third trimester: Secondary | ICD-10-CM

## 2017-03-28 DIAGNOSIS — Z3A40 40 weeks gestation of pregnancy: Secondary | ICD-10-CM | POA: Diagnosis not present

## 2017-03-28 NOTE — OB Triage Note (Signed)
Recvd pt from ED. Pt stated she is unsure if her water broke. States she has been a little crampy today but says she has not had a lot to drink. Denies bleeding. Feeling baby move well. Has no other complaints.

## 2017-03-28 NOTE — Progress Notes (Signed)
   L&D OB Triage Note  SUBJECTIVE Jaime Simmons is a 21 y.o. G1P0 female at 169w4d, EDD Estimated Date of Delivery: 03/24/17 who presented to triage with complaints possible rupture of membranes. She has some thinner discharge/leaking of fluid today. Denies gush, bleeding vaginally. She endorses good fetal moment and states that she has had some cramping.   Obstetric History   G1   P0   T0   P0   A0   L0    SAB0   TAB0   Ectopic0   Multiple0   Live Births0     # Outcome Date GA Lbr Len/2nd Weight Sex Delivery Anes PTL Lv  1 Current               Prescriptions Prior to Admission  Medication Sig Dispense Refill Last Dose  . acyclovir (ZOVIRAX) 800 MG tablet Take 800 mg by mouth 5 (five) times daily.   03/28/2017 at Unknown time  . Prenatal Vit-Fe Fumarate-FA (PRENATAL VITAMINS) 28-0.8 MG TABS Take 1 tablet by mouth daily.   03/28/2017 at Unknown time  . valACYclovir (VALTREX) 500 MG tablet Take 1 tablet (500 mg total) by mouth daily. 30 tablet 1 03/28/2017 at Unknown time     OBJECTIVE  Nursing Evaluation:   BP 126/80 (BP Location: Right Arm)   Pulse 80   Temp 98.3 F (36.8 C) (Oral)   Resp 16   LMP 06/17/2016 (Approximate)    Findings:  Reactive NST  NST was performed and has been reviewed by me.  NST INTERPRETATION: Category I  Mode: External Baseline Rate (A): 130 bpm Variability: Moderate Accelerations: 15 x 15 Decelerations: None     Contraction Frequency (min): rare  ASSESSMENT Impression:  1. Pregnancy:  G1P0 at 709w4d , EDD Estimated Date of Delivery: 03/24/17 2.  Reactive 3. Intact, no fluid seen and Nitrazine negative PLAN 1. Reassurance given 2. Discharge home with precautions to return to L&D or call the office if:   Contractions that are 2-5 minutes apart for 2 hrs. Contractions should have increased in intensity and duration over time, taking her breath away and unable to talk through. A gush of fluid or leaking that you continue to get wet, needing you to put on  a pad or a towel between your legs. Decreased fetal movement.  3. Continue routine prenatal care. Induction Scheduled for Tuesday 03/31/17.   Doreene BurkeAnnie Cassidy Tabet, CNM

## 2017-03-28 NOTE — Discharge Instructions (Signed)
Come back if:  Big gush of fluids Decreased fetal movement Temp over 100.4 Heavy vaginal bleeding Contractions every 3-5 min lasting at least one hour  Get plenty of rest and stay well hydrated! Scheduled induction for Tuesday,  September 4th.

## 2017-03-29 ENCOUNTER — Inpatient Hospital Stay: Payer: Medicaid Other | Admitting: Anesthesiology

## 2017-03-29 ENCOUNTER — Encounter
Admission: EM | Disposition: A | Discharge status: Home / Self Care | Payer: Self-pay | Source: Home / Self Care | Attending: Obstetrics and Gynecology

## 2017-03-29 ENCOUNTER — Encounter: Payer: Self-pay | Admitting: *Deleted

## 2017-03-29 ENCOUNTER — Inpatient Hospital Stay
Admission: EM | Admit: 2017-03-29 | Discharge: 2017-03-31 | DRG: 765 | Disposition: A | Discharge status: Home / Self Care | Payer: Medicaid Other | Attending: Obstetrics and Gynecology | Admitting: Obstetrics and Gynecology

## 2017-03-29 DIAGNOSIS — Z3A40 40 weeks gestation of pregnancy: Secondary | ICD-10-CM

## 2017-03-29 DIAGNOSIS — O324XX0 Maternal care for high head at term, not applicable or unspecified: Principal | ICD-10-CM | POA: Diagnosis present

## 2017-03-29 DIAGNOSIS — O9832 Other infections with a predominantly sexual mode of transmission complicating childbirth: Secondary | ICD-10-CM | POA: Diagnosis present

## 2017-03-29 DIAGNOSIS — D573 Sickle-cell trait: Secondary | ICD-10-CM | POA: Diagnosis present

## 2017-03-29 DIAGNOSIS — A6000 Herpesviral infection of urogenital system, unspecified: Secondary | ICD-10-CM | POA: Diagnosis present

## 2017-03-29 DIAGNOSIS — O9902 Anemia complicating childbirth: Secondary | ICD-10-CM | POA: Diagnosis present

## 2017-03-29 DIAGNOSIS — Z3493 Encounter for supervision of normal pregnancy, unspecified, third trimester: Secondary | ICD-10-CM | POA: Diagnosis present

## 2017-03-29 DIAGNOSIS — O3443 Maternal care for other abnormalities of cervix, third trimester: Secondary | ICD-10-CM | POA: Diagnosis not present

## 2017-03-29 HISTORY — DX: Other specified health status: Z78.9

## 2017-03-29 LAB — CBC WITH DIFFERENTIAL/PLATELET
BASOS ABS: 0 10*3/uL (ref 0–0.1)
BASOS PCT: 0 %
EOS ABS: 0 10*3/uL (ref 0–0.7)
EOS PCT: 0 %
HCT: 32.8 % — ABNORMAL LOW (ref 35.0–47.0)
HEMOGLOBIN: 10.6 g/dL — AB (ref 12.0–16.0)
LYMPHS ABS: 0.9 10*3/uL — AB (ref 1.0–3.6)
Lymphocytes Relative: 6 %
MCH: 22.6 pg — AB (ref 26.0–34.0)
MCHC: 32.3 g/dL (ref 32.0–36.0)
MCV: 70.2 fL — ABNORMAL LOW (ref 80.0–100.0)
Monocytes Absolute: 0.5 10*3/uL (ref 0.2–0.9)
Monocytes Relative: 3 %
Neutro Abs: 14 10*3/uL — ABNORMAL HIGH (ref 1.4–6.5)
Neutrophils Relative %: 91 %
PLATELETS: 141 10*3/uL — AB (ref 150–440)
RBC: 4.68 MIL/uL (ref 3.80–5.20)
RDW: 18.9 % — ABNORMAL HIGH (ref 11.5–14.5)
WBC: 15.5 10*3/uL — ABNORMAL HIGH (ref 3.6–11.0)

## 2017-03-29 LAB — TYPE AND SCREEN
ABO/RH(D): O POS
Antibody Screen: NEGATIVE

## 2017-03-29 SURGERY — Surgical Case
Anesthesia: Epidural

## 2017-03-29 MED ORDER — ACETAMINOPHEN 325 MG PO TABS
650.0000 mg | ORAL_TABLET | ORAL | Status: DC | PRN
  Administered 2017-03-29: 650 mg via ORAL
  Filled 2017-03-29: qty 2

## 2017-03-29 MED ORDER — LACTATED RINGERS IV SOLN
INTRAVENOUS | Status: DC
  Administered 2017-03-29 (×2): via INTRAVENOUS

## 2017-03-29 MED ORDER — LIDOCAINE HCL (CARDIAC) 20 MG/ML IV SOLN
INTRAVENOUS | Status: DC | PRN
  Administered 2017-03-29: 15 mg via INTRAVENOUS
  Administered 2017-03-29: 7 mg via INTRAVENOUS

## 2017-03-29 MED ORDER — FENTANYL CITRATE (PF) 100 MCG/2ML IJ SOLN
25.0000 ug | INTRAMUSCULAR | Status: AC | PRN
  Administered 2017-03-29: 25 ug via INTRAVENOUS

## 2017-03-29 MED ORDER — TERBUTALINE SULFATE 1 MG/ML IJ SOLN: 0.2500 mg | Freq: Once | INTRAMUSCULAR | Status: DC | PRN

## 2017-03-29 MED ORDER — SOD CITRATE-CITRIC ACID 500-334 MG/5ML PO SOLN
30.0000 mL | ORAL | Status: DC | PRN
  Administered 2017-03-29 (×2): 30 mL via ORAL
  Filled 2017-03-29 (×2): qty 15

## 2017-03-29 MED ORDER — LIDOCAINE-EPINEPHRINE (PF) 1.5 %-1:200000 IJ SOLN
INTRAMUSCULAR | Status: DC | PRN
  Administered 2017-03-29: 3 mL via PERINEURAL

## 2017-03-29 MED ORDER — MISOPROSTOL 200 MCG PO TABS
ORAL_TABLET | ORAL | Status: AC
  Filled 2017-03-29: qty 4

## 2017-03-29 MED ORDER — LIDOCAINE HCL (PF) 2 % IJ SOLN
INTRAMUSCULAR | Status: AC
  Filled 2017-03-29: qty 40

## 2017-03-29 MED ORDER — OXYTOCIN 40 UNITS IN LACTATED RINGERS INFUSION - SIMPLE MED
1.0000 m[IU]/min | INTRAVENOUS | Status: DC
  Administered 2017-03-29: 2 m[IU]/min via INTRAVENOUS

## 2017-03-29 MED ORDER — DEXTROSE 5 % IV SOLN: 2.0000 g | INTRAVENOUS | Status: DC

## 2017-03-29 MED ORDER — OXYTOCIN 10 UNIT/ML IJ SOLN
INTRAMUSCULAR | Status: AC
  Filled 2017-03-29: qty 2

## 2017-03-29 MED ORDER — AMMONIA AROMATIC IN INHA
RESPIRATORY_TRACT | Status: AC
  Filled 2017-03-29: qty 10

## 2017-03-29 MED ORDER — LIDOCAINE 5 % EX PTCH
MEDICATED_PATCH | CUTANEOUS | Status: AC
  Filled 2017-03-29: qty 1

## 2017-03-29 MED ORDER — EPHEDRINE 5 MG/ML INJ: 10.0000 mg | INTRAVENOUS | Status: DC | PRN

## 2017-03-29 MED ORDER — FENTANYL 2.5 MCG/ML W/ROPIVACAINE 0.15% IN NS 100 ML EPIDURAL (ARMC)
EPIDURAL | Status: DC | PRN
  Administered 2017-03-29: 12 mL/h via EPIDURAL

## 2017-03-29 MED ORDER — MORPHINE SULFATE (PF) 0.5 MG/ML IJ SOLN
INTRAMUSCULAR | Status: DC | PRN
  Administered 2017-03-29: 3 mg via EPIDURAL

## 2017-03-29 MED ORDER — FENTANYL 2.5 MCG/ML W/ROPIVACAINE 0.15% IN NS 100 ML EPIDURAL (ARMC)
12.0000 mL/h | EPIDURAL | Status: DC
  Administered 2017-03-29: 6 mL/h via EPIDURAL
  Filled 2017-03-29: qty 100

## 2017-03-29 MED ORDER — PHENYLEPHRINE 40 MCG/ML (10ML) SYRINGE FOR IV PUSH (FOR BLOOD PRESSURE SUPPORT): 80.0000 ug | PREFILLED_SYRINGE | INTRAVENOUS | Status: DC | PRN

## 2017-03-29 MED ORDER — BUPIVACAINE HCL (PF) 0.25 % IJ SOLN
INTRAMUSCULAR | Status: DC | PRN
  Administered 2017-03-29: 10 mL via EPIDURAL

## 2017-03-29 MED ORDER — LACTATED RINGERS IV SOLN
500.0000 mL | INTRAVENOUS | Status: DC | PRN
  Administered 2017-03-29 (×2): 1000 mL via INTRAVENOUS

## 2017-03-29 MED ORDER — CEFAZOLIN SODIUM-DEXTROSE 2-4 GM/100ML-% IV SOLN
2.0000 g | Freq: Once | INTRAVENOUS | Status: DC
Start: 2017-03-29 — End: 2017-03-30
  Filled 2017-03-29: qty 100

## 2017-03-29 MED ORDER — MORPHINE SULFATE (PF) 2 MG/ML IV SOLN
1.0000 mg | INTRAVENOUS | Status: DC | PRN
  Administered 2017-03-30: 1 mg via INTRAVENOUS
  Filled 2017-03-29: qty 1

## 2017-03-29 MED ORDER — LACTATED RINGERS IV SOLN: 500.0000 mL | Freq: Once | INTRAVENOUS | Status: DC

## 2017-03-29 MED ORDER — LIDOCAINE HCL (PF) 1 % IJ SOLN
INTRAMUSCULAR | Status: AC
  Filled 2017-03-29: qty 30

## 2017-03-29 MED ORDER — LIDOCAINE HCL (PF) 1 % IJ SOLN: 30.0000 mL | INTRAMUSCULAR | Status: DC | PRN

## 2017-03-29 MED ORDER — LIDOCAINE HCL (PF) 1 % IJ SOLN
INTRAMUSCULAR | Status: DC | PRN
  Administered 2017-03-29: 3 mL

## 2017-03-29 MED ORDER — OXYTOCIN BOLUS FROM INFUSION: 500.0000 mL | Freq: Once | INTRAVENOUS | Status: DC

## 2017-03-29 MED ORDER — MORPHINE SULFATE (PF) 0.5 MG/ML IJ SOLN
INTRAMUSCULAR | Status: AC
  Filled 2017-03-29: qty 10

## 2017-03-29 MED ORDER — OXYTOCIN 40 UNITS IN LACTATED RINGERS INFUSION - SIMPLE MED
INTRAVENOUS | Status: AC
  Administered 2017-03-29: 2 m[IU]/min via INTRAVENOUS
  Filled 2017-03-29: qty 1000

## 2017-03-29 MED ORDER — OXYTOCIN 40 UNITS IN LACTATED RINGERS INFUSION - SIMPLE MED
2.5000 [IU]/h | INTRAVENOUS | Status: DC
  Administered 2017-03-29: 40 mL via INTRAVENOUS

## 2017-03-29 MED ORDER — FENTANYL CITRATE (PF) 100 MCG/2ML IJ SOLN
25.0000 ug | INTRAMUSCULAR | Status: DC | PRN
  Administered 2017-03-29: 25 ug via INTRAVENOUS

## 2017-03-29 MED ORDER — FENTANYL 2.5 MCG/ML W/ROPIVACAINE 0.15% IN NS 100 ML EPIDURAL (ARMC)
EPIDURAL | Status: AC
  Filled 2017-03-29: qty 100

## 2017-03-29 MED ORDER — FENTANYL CITRATE (PF) 100 MCG/2ML IJ SOLN
INTRAMUSCULAR | Status: AC
  Administered 2017-03-29: 25 ug via INTRAVENOUS
  Filled 2017-03-29: qty 2

## 2017-03-29 MED ORDER — ONDANSETRON HCL 4 MG/2ML IJ SOLN
4.0000 mg | Freq: Four times a day (QID) | INTRAMUSCULAR | Status: DC | PRN
  Administered 2017-03-29: 4 mg via INTRAVENOUS

## 2017-03-29 MED ORDER — ONDANSETRON HCL 4 MG/2ML IJ SOLN: 4.0000 mg | Freq: Once | INTRAMUSCULAR | Status: DC | PRN

## 2017-03-29 MED ORDER — BUTORPHANOL TARTRATE 2 MG/ML IJ SOLN: 1.0000 mg | INTRAMUSCULAR | Status: DC | PRN

## 2017-03-29 MED ORDER — DIPHENHYDRAMINE HCL 50 MG/ML IJ SOLN: 12.5000 mg | INTRAMUSCULAR | Status: DC | PRN

## 2017-03-29 MED ORDER — LACTATED RINGERS IV SOLN
INTRAVENOUS | Status: DC | PRN
  Administered 2017-03-29: 23:00:00 via INTRAVENOUS

## 2017-03-29 SURGICAL SUPPLY — 23 items
BAG COUNTER SPONGE EZ (MISCELLANEOUS) ×2 IMPLANT
CANISTER SUCT 3000ML PPV (MISCELLANEOUS) ×3 IMPLANT
CHLORAPREP W/TINT 26ML (MISCELLANEOUS) ×6 IMPLANT
CLOSURE WOUND 1/2 X4 (GAUZE/BANDAGES/DRESSINGS) ×1
COUNTER SPONGE BAG EZ (MISCELLANEOUS) ×1
DRSG TELFA 3X8 NADH (GAUZE/BANDAGES/DRESSINGS) ×3 IMPLANT
GAUZE SPONGE 4X4 12PLY STRL (GAUZE/BANDAGES/DRESSINGS) ×3 IMPLANT
GLOVE INDICATOR 7.0 STRL GRN (GLOVE) ×9 IMPLANT
GLOVE ORTHO TXT STRL SZ7.5 (GLOVE) ×9 IMPLANT
GLOVE PROTEXIS LATEX SZ 7.5 (GLOVE) ×3 IMPLANT
GOWN STRL REUS W/ TWL LRG LVL3 (GOWN DISPOSABLE) ×3 IMPLANT
GOWN STRL REUS W/TWL LRG LVL3 (GOWN DISPOSABLE) ×6
KIT RM TURNOVER STRD PROC AR (KITS) ×3 IMPLANT
NS IRRIG 1000ML POUR BTL (IV SOLUTION) ×3 IMPLANT
PACK C SECTION AR (MISCELLANEOUS) ×3 IMPLANT
PAD OB MATERNITY 4.3X12.25 (PERSONAL CARE ITEMS) ×3 IMPLANT
PAD PREP 24X41 OB/GYN DISP (PERSONAL CARE ITEMS) ×3 IMPLANT
RTRCTR C-SECT PINK 25CM LRG (MISCELLANEOUS) ×3 IMPLANT
SPONGE LAP 18X18 5 PK (GAUZE/BANDAGES/DRESSINGS) ×3 IMPLANT
STRIP CLOSURE SKIN 1/2X4 (GAUZE/BANDAGES/DRESSINGS) ×2 IMPLANT
SUT VIC AB 1 CT1 36 (SUTURE) ×6 IMPLANT
SUT VICRYL+ 3-0 36IN CT-1 (SUTURE) ×6 IMPLANT
SWABSTK COMLB BENZOIN TINCTURE (MISCELLANEOUS) ×3 IMPLANT

## 2017-03-29 NOTE — H&P (Signed)
History and Physical   HPI  Jaime Simmons is a 21 y.o. G1P0 at [redacted]w[redacted]d Estimated Date of Delivery: 03/24/17 who is being admitted for  labor management. She has been contracting since yesterday . She denies gush of fluid and vaginal bleeding. She endorses good fetal movement.    OB History  Obstetric History   G1   P0   T0   P0   A0   L0    SAB0   TAB0   Ectopic0   Multiple0   Live Births0     # Outcome Date GA Lbr Len/2nd Weight Sex Delivery Anes PTL Lv  1 Current               PROBLEM LIST  Pregnancy complications or risks: Patient Active Problem List   Diagnosis Date Noted  . Labor and delivery, indication for care 03/28/2017  . Echogenic intracardiac focus of fetus on prenatal ultrasound 11/03/2016  . Sickle cell trait (HCC) 11/03/2016  . Amenorrhea 08/06/2016  . H/O herpes genitalis     Prenatal labs and studies: ABO, Rh: O/Positive/-- (01/10 0430) Antibody: Negative (01/10 0430) Rubella: 26.20 (01/10 0430) RPR: Non Reactive (01/10 0430)  HBsAg: Negative (01/10 0430)  HIV:   Non Reactive WUJ:[id number] (08/03 1524) 1 hr Glucola 74 Genetic screening normal Anatomy US echogenic intracardic focus   Prenatal Transfer Tool  Maternal Diabetes: No Genetic Screening: Normal Maternal Ultrasounds/Referrals: Abnormal:  Findings:   Isolated EIF (echogenic intracardiac focus) Fetal Ultrasounds or other Referrals:  Referred to Materal Fetal Medicine  Maternal Substance Abuse:  No Significant Maternal Medications:  Meds include: Other: acyclovir for HSV suppression  Significant Maternal Lab Results: None   Past Medical History:  Diagnosis Date  . H/O herpes genitalis   . Medical history non-contributory      Past Surgical History:  Procedure Laterality Date  . NO PAST SURGERIES    . none       Medications    Current Discharge Medication List    CONTINUE these medications which have NOT CHANGED   Details  Prenatal Vit-Fe Fumarate-FA (PRENATAL VITAMINS) 28-0.8  MG TABS Take 1 tablet by mouth daily.    valACYclovir (VALTREX) 500 MG tablet Take 1 tablet (500 mg total) by mouth daily. Qty: 30 tablet, Refills: 1    acyclovir (ZOVIRAX) 800 MG tablet Take 800 mg by mouth 5 (five) times daily.         Allergies  Patient has no known allergies.  Review of Systems  Constitutional: negative Eyes: negative Ears, nose, mouth, throat, and face: negative Respiratory: negative Cardiovascular: negative Gastrointestinal: negative Genitourinary:negative Integument/breast: negative Hematologic/lymphatic: negative Musculoskeletal:negative Neurological: negative Behavioral/Psych: negative Endocrine: negative Allergic/Immunologic: negative  Physical Exam  BP 138/79 (BP Location: Left Arm)   Pulse 72   Temp 98.3 F (36.8 C) (Oral)   Resp 16   Ht 4\' 11"  (1.499 m)   LMP 06/17/2016 (Approximate)   Lungs:  CTA B Cardio: RRR  Abd: Soft, gravid, NT Presentation: cephalic EXT: No C/C/ 1+ Edema DTRs: 2+ B CERVIX: 6 cm bulging bag per RN exam: See Prenatal records for more detailed PE.     FHR:  Baseline: 130 bpm, Variability: Good {> 6 bpm), Accelerations: Reactive and Decelerations: Absent  Toco: Uterine Contractions: Frequency: Every 3-4 minutes, Duration: 50-80 seconds and Intensity: moderate   Test Results  No results found for this or any previous visit (from the past 24 hour(s)). Group B Strep negative  Assessment   G1P0 at  8373w5d Estimated Date of Delivery: 03/24/17  The fetus is reassuring. Reactive  Patient Active Problem List   Diagnosis Date Noted  . Labor and delivery, indication for care 03/28/2017  . Echogenic intracardiac focus of fetus on prenatal ultrasound 11/03/2016  . Sickle cell trait (HCC) 11/03/2016  . Amenorrhea 08/06/2016  . H/O herpes genitalis     Plan  1. Admit to L&D for expectant management 2. EFM:-- Category 1 3. Epidural if desired. Stadol for IV pain until epidural requested. 4. Admission  labs  5. Anticipate NSVD  Doreene Burkennie Kripa Foskey, CNM 03/29/2017 10:24 AM

## 2017-03-29 NOTE — Anesthesia Procedure Notes (Signed)
Epidural Patient location during procedure: OB Start time: 03/29/2017 1:00 PM End time: 03/29/2017 1:10 PM  Staffing Anesthesiologist: Yves DillARROLL, Sabin Gibeault Performed: anesthesiologist   Preanesthetic Checklist Completed: patient identified, site marked, surgical consent, pre-op evaluation, timeout performed, IV checked, risks and benefits discussed and monitors and equipment checked  Epidural Patient position: sitting Prep: Betadine Patient monitoring: heart rate, continuous pulse ox and blood pressure Approach: midline Location: L3-L4 Injection technique: LOR air  Needle:  Needle type: Tuohy  Needle gauge: 17 G Needle length: 9 cm and 9 Catheter type: closed end flexible Catheter size: 19 Gauge Test dose: negative and 1.5% lidocaine with Epi 1:200 K  Assessment Events: blood not aspirated, injection not painful, no injection resistance, negative IV test and no paresthesia  Additional Notes Time out called.  Patient placed in sitting position.  Back prepped and draped in sterile fashion.  A skin wheal was made in the L3-L4 interspace with 1% Lidocaine plain.  A 17G Tuohy needle was advanced into the epidural space by a loss of resistance technique.  The catheter was threaded 3 cm and the TD was negative.  The catheter was affixed to the back in sterile fashion.  No blood or paresthesias.  The patient tolerated the procedure well.Reason for block:procedure for pain

## 2017-03-29 NOTE — Transfer of Care (Signed)
Immediate Anesthesia Transfer of Care Note  Patient: Jaime Simmons  Procedure(s) Performed: Procedure(s): CESAREAN SECTION (N/A)  Patient Location: PACU  Anesthesia Type:Epidural  Level of Consciousness: awake, alert  and oriented  Airway & Oxygen Therapy: Patient Spontanous Breathing  Post-op Assessment: Report given to RN  Post vital signs: Reviewed and stable  Last Vitals:  Vitals:   03/29/17 2100 03/29/17 2123  BP: (!) 141/85   Pulse: (!) 103   Resp: 18   Temp:  37.5 C  SpO2:      Last Pain:  Vitals:   03/29/17 2123  TempSrc: Oral  PainSc:          Complications: No apparent anesthesia complications

## 2017-03-29 NOTE — OB Triage Note (Signed)
Ctx since yesterday. Worsening around midnight. Unable to sleep last evening. Jaime Simmons, [name] S

## 2017-03-29 NOTE — Progress Notes (Signed)
LABOR NOTE   Jaime Simmons 21 y.o.GP@ at 4973w5d Active phase labor.  SUBJECTIVE:  She is comfortable with her epidural. Denies urge to push at this time.  OBJECTIVE:  BP 116/67   Pulse 77   Temp 98.3 F (36.8 C) (Oral)   Resp 16   Ht 4\' 11"  (1.499 m)   Wt 146 lb (66.2 kg)   LMP 06/17/2016 (Approximate)   SpO2 100%   BMI 29.49 kg/m  No intake/output data recorded.  She has shown cervical change. CERVIX: Per RN exam 8 cm SVE:   Dilation: 8 Effacement (%): 90 Station: 0 Exam by:: LSE CONTRACTIONS: regular, every 2-3 minutes FHR: Fetal heart tracing reviewed. Baseline: 130 bpm, Variability: Good {> 6 bpm), Accelerations: Reactive and Decelerations: Early Category I   Analgesia: Epidural  Labs: Lab Results  Component Value Date   WBC 15.5 (H) 03/29/2017   HGB 10.6 (L) 03/29/2017   HCT 32.8 (L) 03/29/2017   MCV 70.2 (L) 03/29/2017   PLT 141 (L) 03/29/2017    ASSESSMENT: 1) Labor curve reviewed.       Progress: Active phase labor., Adequate uterine activity - intensity and frequency. and Satisfactory labor progress.     Membranes: ruptured, clear fluid       Active Problems:   Labor and delivery, indication for care   PLAN: continue present management and anticipate vaginal delivery  Doreene Burkennie Irbin Fines, CNM 03/29/2017 4:37 PM

## 2017-03-29 NOTE — Anesthesia Preprocedure Evaluation (Signed)
Anesthesia Evaluation  Patient identified by MRN, date of birth, ID band Patient awake    Reviewed: Allergy & Precautions, NPO status , Patient's Chart, lab work & pertinent test results  Airway Mallampati: II  TM Distance: >3 FB     Dental  (+) Teeth Intact   Pulmonary neg pulmonary ROS,    Pulmonary exam normal        Cardiovascular negative cardio ROS Normal cardiovascular exam     Neuro/Psych negative neurological ROS  negative psych ROS   GI/Hepatic negative GI ROS, Neg liver ROS,   Endo/Other  negative endocrine ROS  Renal/GU negative Renal ROS  negative genitourinary   Musculoskeletal negative musculoskeletal ROS (+)   Abdominal Normal abdominal exam  (+)   Peds negative pediatric ROS (+)  Hematology negative hematology ROS (+)   Anesthesia Other Findings   Reproductive/Obstetrics (+) Pregnancy                             Anesthesia Physical Anesthesia Plan  ASA: II  Anesthesia Plan: Epidural   Post-op Pain Management:    Induction:   PONV Risk Score and Plan:   Airway Management Planned: Natural Airway  Additional Equipment:   Intra-op Plan:   Post-operative Plan:   Informed Consent: I have reviewed the patients History and Physical, chart, labs and discussed the procedure including the risks, benefits and alternatives for the proposed anesthesia with the patient or authorized representative who has indicated his/her understanding and acceptance.   Dental advisory given  Plan Discussed with: CRNA and Surgeon  Anesthesia Plan Comments:         Anesthesia Quick Evaluation  

## 2017-03-29 NOTE — Anesthesia Post-op Follow-up Note (Signed)
Anesthesia QCDR form completed.        

## 2017-03-29 NOTE — Op Note (Signed)
      OP NOTE  Date @EDTODAYDATE @ @TIMESTAMP @ Name Jaime Simmons MR# [id number]  Preoperative Diagnosis: 1. Intrauterine pregnancy at 1347w5d Active Problems:   Labor and delivery, indication for care  2.  failure to progress: arrest of descent  Postoperative Diagnosis: 1. Intrauterine pregnancy at 7647w5d, delivered 2. Viable infant 3. Remainder same as pre-op   Procedure: 1. Primary Low-Transverse Cesarean Section  Surgeon: Elonda Huskyavid J. Evans, MD  Assistant:  Doreene BurkeAnnie Thompson  Anesthesia: epidural  EBL: 750 mL  Findings: 1. Female infant in the cephalic presentation.  2. Normal uterus, tubes and ovaries.    Procedure:  The patient was prepped and draped in the supine position and placed under spinal anesthesia.  A transverse incision was made across the abdomen in a Pfannenstiel manner. If indicated the old scar was systematically removed with sharp dissection.  We carried the dissection down to the level of the fascia.  The fascia was incised in a curvilinear manner.  The fascia was then elevated from the rectus muscles with blunt and sharp dissection.  The rectus muscles were separated laterally exposing the peritoneum.  The peritoneum was carefully entered with care being taken to avoid bowel and bladder.  A self-retaining retractor was placed.  The visceral peritoneum was incised in a curvilinear fashion across the lower uterine segment creating a bladder flap. A transverse incision was made across the lower uterine segment and extended laterally and superiorly using the bandage scissors.  Artificial rupture membranes was performed and Clear fluid was noted.  The infant was delivered from the cephalic position.  The head was noted to be wedged into the pelvis. The cord was doubly clamped and cut. Cord blood was obtained.  The infant was handed to the pediatrician who then placed the infant under heat lamps where it was cleaned dried and re-suctioned. The placenta was delivered. The  hysterotomy incision was then identified on ring forceps.  The uterine cavity was cleaned with a moist lap sponge.  The hysterotomy incision was closed with a running interlocking suture of Vicryl.  Hemostasis was excellent.  Pitocin was run in the IV and the uterus was found to be firm. The posterior cul-de-sac and gutters were cleaned and inspected.  Hemostasis was noted.  The fascia was then closed with a running suture of #1 Vicryl.  Hemostasis of the subcutaneous tissues was obtained using the Bovie.  The subcutaneous tissues were closed with a running suture of 000 Vicryl.  A subcuticular suture was placed.  Steri-Strips were applied in the usual manner.  A pressure dressing was placed.  The patient went to the recovery room in stable condition.   Elonda Huskyavid J. Evans, M.D. 03/29/2017 11:31 PM

## 2017-03-29 NOTE — Anesthesia Procedure Notes (Signed)
Date/Time: 03/29/2017 10:30 PM Performed by: Junious SilkNOLES, Ellington Cornia Pre-anesthesia Checklist: Patient identified, Emergency Drugs available, Suction available, Patient being monitored and Timeout performed Oxygen Delivery Method: Nasal cannula

## 2017-03-29 NOTE — Progress Notes (Signed)
LABOR NOTE   Jaime Simmons 21 y.o.GP@ at 87110w5d Active phase labor. and Adequate uterine activity - intensity and frequency.  SUBJECTIVE:  Pt complaining of pressure like she needs to have bowel movement.  OBJECTIVE:  BP 131/81   Pulse 93   Temp 98.5 F (36.9 C) (Oral)   Resp 16   Ht 4\' 11"  (1.499 m)   Wt 146 lb (66.2 kg)   LMP 06/17/2016 (Approximate)   SpO2 100%   BMI 29.49 kg/m  No intake/output data recorded.  She has shown cervical change. CERVIX: 10:100%: -1:  SVE:   Dilation: 10 Effacement (%): 90 Station: -1 Exam by:: Janee Mornhompson, CNM CONTRACTIONS: regular, every 2-3 minutes FHR: Fetal heart tracing reviewed. Baseline: 140 bpm, Variability: Fair (1-6 bpm), Accelerations: Non-reactive but appropriate for gestational age and Decelerations: Early Category II   Analgesia: Epidural  Labs: Lab Results  Component Value Date   WBC 15.5 (H) 03/29/2017   HGB 10.6 (L) 03/29/2017   HCT 32.8 (L) 03/29/2017   MCV 70.2 (L) 03/29/2017   PLT 141 (L) 03/29/2017    ASSESSMENT: 1) Labor curve reviewed.       Progress: Active phase labor. and Adequate uterine activity - intensity and frequency.     Membranes: ruptured, clear fluid      2)  In effective pushing  Active Problems:   Labor and delivery, indication for care   PLAN: continue present management, IV Pitocin augmentation and pt to her left side with peanut ball, laboring down.   Jaime Burkennie Jaime Simmons, CNM 03/29/2017 6:55 PM

## 2017-03-30 ENCOUNTER — Encounter: Payer: Self-pay | Admitting: Obstetrics and Gynecology

## 2017-03-30 LAB — CHLAMYDIA/NGC RT PCR (ARMC ONLY)
Chlamydia Tr: NOT DETECTED
N gonorrhoeae: NOT DETECTED

## 2017-03-30 LAB — CBC
HCT: 28.7 % — ABNORMAL LOW (ref 35.0–47.0)
HEMOGLOBIN: 9.4 g/dL — AB (ref 12.0–16.0)
MCH: 22.8 pg — AB (ref 26.0–34.0)
MCHC: 32.8 g/dL (ref 32.0–36.0)
MCV: 69.4 fL — AB (ref 80.0–100.0)
Platelets: 123 10*3/uL — ABNORMAL LOW (ref 150–440)
RBC: 4.14 MIL/uL (ref 3.80–5.20)
RDW: 18.7 % — ABNORMAL HIGH (ref 11.5–14.5)
WBC: 18.3 10*3/uL — ABNORMAL HIGH (ref 3.6–11.0)

## 2017-03-30 LAB — RPR: RPR Ser Ql: NONREACTIVE

## 2017-03-30 MED ORDER — PRENATAL MULTIVITAMIN CH
1.0000 | ORAL_TABLET | Freq: Every day | ORAL | Status: DC
  Administered 2017-03-30: 1 via ORAL
  Filled 2017-03-30 (×2): qty 1

## 2017-03-30 MED ORDER — SENNOSIDES-DOCUSATE SODIUM 8.6-50 MG PO TABS
2.0000 | ORAL_TABLET | ORAL | Status: DC
  Administered 2017-03-30: 2 via ORAL
  Filled 2017-03-30: qty 2

## 2017-03-30 MED ORDER — IBUPROFEN 200 MG PO TABS
600.0000 mg | ORAL_TABLET | Freq: Four times a day (QID) | ORAL | Status: DC
  Administered 2017-03-30 – 2017-03-31 (×5): 600 mg via ORAL
  Filled 2017-03-30 (×6): qty 3

## 2017-03-30 MED ORDER — LACTATED RINGERS IV SOLN: INTRAVENOUS | Status: DC

## 2017-03-30 MED ORDER — COCONUT OIL OIL: 1.0000 "application " | TOPICAL_OIL | Status: DC | PRN

## 2017-03-30 MED ORDER — OXYCODONE-ACETAMINOPHEN 5-325 MG PO TABS
2.0000 | ORAL_TABLET | ORAL | Status: DC | PRN
  Administered 2017-03-30 – 2017-03-31 (×3): 2 via ORAL
  Filled 2017-03-30 (×2): qty 2

## 2017-03-30 MED ORDER — MENTHOL 3 MG MT LOZG
1.0000 | LOZENGE | OROMUCOSAL | Status: DC | PRN
  Filled 2017-03-30: qty 9

## 2017-03-30 MED ORDER — SIMETHICONE 80 MG PO CHEW
80.0000 mg | CHEWABLE_TABLET | Freq: Four times a day (QID) | ORAL | Status: DC
  Administered 2017-03-30 – 2017-03-31 (×6): 80 mg via ORAL
  Filled 2017-03-30 (×6): qty 1

## 2017-03-30 MED ORDER — DIBUCAINE 1 % RE OINT: 1.0000 "application " | TOPICAL_OINTMENT | RECTAL | Status: DC | PRN

## 2017-03-30 MED ORDER — DIPHENHYDRAMINE HCL 25 MG PO CAPS: 25.0000 mg | ORAL_CAPSULE | Freq: Four times a day (QID) | ORAL | Status: DC | PRN

## 2017-03-30 MED ORDER — OXYTOCIN 40 UNITS IN LACTATED RINGERS INFUSION - SIMPLE MED: 2.5000 [IU]/h | INTRAVENOUS | Status: DC

## 2017-03-30 MED ORDER — OXYCODONE-ACETAMINOPHEN 5-325 MG PO TABS: 1.0000 | ORAL_TABLET | ORAL | Status: DC | PRN

## 2017-03-30 MED ORDER — ZOLPIDEM TARTRATE 5 MG PO TABS: 5.0000 mg | ORAL_TABLET | Freq: Every evening | ORAL | Status: DC | PRN

## 2017-03-30 MED ORDER — WITCH HAZEL-GLYCERIN EX PADS: 1.0000 "application " | MEDICATED_PAD | CUTANEOUS | Status: DC | PRN

## 2017-03-30 MED ORDER — IBUPROFEN 600 MG PO TABS
600.0000 mg | ORAL_TABLET | Freq: Four times a day (QID) | ORAL | Status: DC
  Administered 2017-03-30: 600 mg via ORAL
  Filled 2017-03-30 (×3): qty 1

## 2017-03-30 MED ORDER — ACETAMINOPHEN 325 MG PO TABS: 650.0000 mg | ORAL_TABLET | ORAL | Status: DC | PRN

## 2017-03-30 NOTE — Anesthesia Postprocedure Evaluation (Signed)
Anesthesia Post Note  Patient: Jaime Simmons  Procedure(s) Performed: Procedure(s) (LRB): CESAREAN SECTION (N/A)  Patient location during evaluation: Mother Baby Anesthesia Type: Epidural Level of consciousness: awake and alert Pain management: pain level controlled Vital Signs Assessment: post-procedure vital signs reviewed and stable Respiratory status: spontaneous breathing, nonlabored ventilation and respiratory function stable Cardiovascular status: stable Postop Assessment: no headache, no backache, epidural receding, no signs of nausea or vomiting and adequate PO intake Anesthetic complications: no     Last Vitals:  Vitals:   03/30/17 0355 03/30/17 0505  BP: 119/80 110/61  Pulse: 95 85  Resp: 20 20  Temp: 36.9 C 37.1 C  SpO2: 100% 99%    Last Pain:  Vitals:   03/30/17 0730  TempSrc:   PainSc: 10-Worst pain ever                 Rica MastBachich,  Dim Meisinger M

## 2017-03-30 NOTE — Progress Notes (Signed)
Patient ID: Jaime Simmons, female   DOB: 10/20/1995, 21 y.o.   MRN: [id number]   Progress Note - Cesarean Delivery  Jaime Simmons is a 21 y.o. G1P1001 now PP day 1 s/p C-Section, Low Transverse .   Subjective:  Patient reports no problems with eating, bowel movements, voiding, or their wound   Objective:  Vital signs in last 24 hours: Temp:  [97.8 F (36.6 C)-100.1 F (37.8 C)] 98.5 F (36.9 C) (09/03 0804) Pulse Rate:  [72-170] 111 (09/03 0804) Resp:  [0-27] 17 (09/03 0804) BP: (98-148)/(58-103) 127/76 (09/03 0804) SpO2:  [96 %-100 %] 98 % (09/03 0804) Weight:  [146 lb (66.2 kg)] 146 lb (66.2 kg) (09/02 0957)  Physical Exam:  General: alert, cooperative and no distress Lochia: appropriate Uterine Fundus: firm Incision: dressing intact DVT Evaluation: No evidence of DVT seen on physical exam.    Data Review  Recent Labs  03/29/17 1213 03/30/17 0514  HGB 10.6* 9.4*  HCT 32.8* 28.7*    Assessment:  Active Problems:   Labor and delivery, indication for care   Status post Cesarean section. Doing well postoperatively.     Plan:       Continue current care.    Elonda Huskyavid J. Jalise Zawistowski, M.D. 03/30/2017 8:07 AM

## 2017-03-30 NOTE — Anesthesia Post-op Follow-up Note (Signed)
  Anesthesia Pain Follow-up Note  Patient: Jaime DredgeQiana R Neidert  Day #: 1  Date of Follow-up: 03/30/2017 Time: 7:37 AM  Last Vitals:  Vitals:   03/30/17 0355 03/30/17 0505  BP: 119/80 110/61  Pulse: 95 85  Resp: 20 20  Temp: 36.9 C 37.1 C  SpO2: 100% 99%    Level of Consciousness: alert  Pain: moderate   Side Effects:None  Catheter Site Exam:clean, dry     Plan: D/C from anesthesia care at surgeon's request  Rica MastBachich,  Cedar Roseman M

## 2017-03-31 MED ORDER — MEDROXYPROGESTERONE ACETATE 150 MG/ML IM SUSP: 150.0000 mg | Freq: Once | INTRAMUSCULAR | 3 refills | Status: DC

## 2017-03-31 MED ORDER — OXYCODONE-ACETAMINOPHEN 5-325 MG PO TABS: 2.0000 | ORAL_TABLET | ORAL | 0 refills | Status: AC | PRN

## 2017-03-31 MED ORDER — IBUPROFEN 600 MG PO TABS: 600.0000 mg | ORAL_TABLET | Freq: Four times a day (QID) | ORAL | 0 refills | Status: AC

## 2017-03-31 MED ORDER — MEDROXYPROGESTERONE ACETATE 150 MG/ML IM SUSP
150.0000 mg | Freq: Once | INTRAMUSCULAR | Status: AC
  Administered 2017-03-31: 150 mg via INTRAMUSCULAR
  Filled 2017-03-31: qty 1

## 2017-03-31 MED ORDER — DOCUSATE SODIUM 100 MG PO CAPS: 100.0000 mg | ORAL_CAPSULE | Freq: Two times a day (BID) | ORAL | 0 refills | Status: AC

## 2017-03-31 NOTE — Final Progress Note (Signed)
Discharge Day SOAP Note:  Subjective:  The patient has no complaints.  She is ambulating well. She is taking PO well. Pain is well controlled with current medications. Patient is urinating without difficulty.   She is passing flatus.   Objective  Vital signs in last 24 hours: BP 124/68 (BP Location: Left Arm)   Pulse (!) 107 Comment: nurse Harless LittenKendra Pierce notified  Temp 98.9 F (37.2 C) (Oral)   Resp 18   Ht 4\' 11"  (1.499 m)   Wt 146 lb (66.2 kg)   LMP 06/17/2016 (Approximate)   SpO2 98%   Breastfeeding? Unknown   BMI 29.49 kg/m   Physical Exam: Gen: NAD Abdomen:  clean, dry, no drainage Fundus Fundal Tone: Firm  Lochia Amount: Scant     Data Review Labs: CBC Latest Ref Rng & Units 03/30/2017 03/29/2017 12/26/2016  WBC 3.6 - 11.0 K/uL 18.3(H) 15.5(H) -  Hemoglobin 12.0 - 16.0 g/dL 1.6(X9.4(L) 10.6(L) 11.5  Hematocrit 35.0 - 47.0 % 28.7(L) 32.8(L) 35.8  Platelets 150 - 440 K/uL 123(L) 141(L) -   O POS  Assessment:  Active Problems:   Labor and delivery, indication for care   Doing well.  Normal progress as expected.    Plan:  Discharge to home  Modified rest as directed - may slowly resume normal activities with restrictions  as discussed.  Medications as written.  See below for additional.       Discharge Instructions: Per After Visit Summary. Activity: Advance as tolerated. Pelvic rest for 6 weeks.  Also refer to After Visit Summary.  Wound care discussed. Diet: Regular Medications: Allergies as of 03/31/2017   No Known Allergies     Medication List    STOP taking these medications   acyclovir 800 MG tablet Commonly known as:  ZOVIRAX     TAKE these medications   docusate sodium 100 MG capsule Commonly known as:  COLACE Take 1 capsule (100 mg total) by mouth 2 (two) times daily.   ibuprofen 600 MG tablet Commonly known as:  ADVIL,MOTRIN Take 1 tablet (600 mg total) by mouth every 6 (six) hours.   medroxyPROGESTERone 150 MG/ML  injection Commonly known as:  DEPO-PROVERA Inject 1 mL (150 mg total) into the muscle once.   oxyCODONE-acetaminophen 5-325 MG tablet Commonly known as:  PERCOCET/ROXICET Take 2 tablets by mouth every 4 (four) hours as needed (pain scale > 7).   Prenatal Vitamins 28-0.8 MG Tabs Take 1 tablet by mouth daily.   valACYclovir 500 MG tablet Commonly known as:  VALTREX Take 1 tablet (500 mg total) by mouth daily.            Discharge Care Instructions        Start     Ordered   03/31/17 0000  ibuprofen (ADVIL,MOTRIN) 600 MG tablet  Every 6 hours     03/31/17 0832   03/31/17 0000  oxyCODONE-acetaminophen (PERCOCET/ROXICET) 5-325 MG tablet  Every 4 hours PRN     03/31/17 0832   03/31/17 0000  docusate sodium (COLACE) 100 MG capsule  2 times daily     03/31/17 0832   03/31/17 0000  medroxyPROGESTERone (DEPO-PROVERA) 150 MG/ML injection   Once     03/31/17 [id number]     Outpatient follow up:  Postpartum contraception: Depo-Provera 1st dose @ hospital before discharge  Discharged Condition: good  Discharged to: home  Newborn Data: Disposition:home with mother  Apgars: APGAR (1 MIN): 6   APGAR (5 MINS): 8   APGAR (10 MINS):  Baby Feeding: Bottle  Doreene Burke, CNM 03/31/2017 8:37 AM

## 2017-03-31 NOTE — Progress Notes (Signed)
Pt discharged with infant.  Discharge instructions and follow up appointment given to and reviewed with pt.  Escorted out by staff. 

## 2017-03-31 NOTE — Discharge Summary (Signed)
@[email or web address] @  Physician Obstetric Discharge Summary  Patient ID: Jaime Simmons MRN: [id number] DOB/AGE: 21/03/1996 21 y.o.   Date of Admission: 03/29/2017  Date of Discharge: 03/31/17  Admitting Diagnosis: Onset of Labor at 3223w5d  Secondary Diagnosis: None  Mode of Delivery: low uterine, transverse     Discharge Diagnosis: No other diagnosis and Reasons for cesarean section  Arrest of Descent   Intrapartum Procedures: epidural and pitocin augmentation   Post partum procedures: None  Complications: none                        Discharge Day SOAP Note:  Subjective:  The patient has no complaints.  She is ambulating well. She is taking PO well. Pain is well controlled with current medications. Patient is urinating without difficulty.   She is passing flatus.   Objective  Vital signs in last 24 hours: BP 124/68 (BP Location: Left Arm)   Pulse (!) 107 Comment: nurse Harless LittenKendra Pierce notified  Temp 98.9 F (37.2 C) (Oral)   Resp 18   Ht 4\' 11"  (1.499 m)   Wt 146 lb (66.2 kg)   LMP 06/17/2016 (Approximate)   SpO2 98%   Breastfeeding? Unknown   BMI 29.49 kg/m   Physical Exam: Gen: NAD Abdomen:  clean, dry, no drainage Fundus Fundal Tone: Firm  Lochia Amount: Scant     Data Review Labs: CBC Latest Ref Rng & Units 03/30/2017 03/29/2017 12/26/2016  WBC 3.6 - 11.0 K/uL 18.3(H) 15.5(H) -  Hemoglobin 12.0 - 16.0 g/dL 4.0(J9.4(L) 10.6(L) 11.5  Hematocrit 35.0 - 47.0 % 28.7(L) 32.8(L) 35.8  Platelets 150 - 440 K/uL 123(L) 141(L) -   O POS  Assessment:  Active Problems:   Labor and delivery, indication for care   Doing well.  Normal progress as expected.    Plan:  Discharge to home  Modified rest as directed - may slowly resume normal activities with restrictions  as discussed.  Medications as written.  See below for additional.       Discharge Instructions: Per After Visit Summary. Activity: Advance as tolerated. Pelvic rest for 6 weeks.  Also refer to After Visit Summary.   Wound care discussed. Diet: Regular Medications: Allergies as of 03/31/2017   No Known Allergies     Medication List    STOP taking these medications   acyclovir 800 MG tablet Commonly known as:  ZOVIRAX     TAKE these medications   docusate sodium 100 MG capsule Commonly known as:  COLACE Take 1 capsule (100 mg total) by mouth 2 (two) times daily.   ibuprofen 600 MG tablet Commonly known as:  ADVIL,MOTRIN Take 1 tablet (600 mg total) by mouth every 6 (six) hours.   medroxyPROGESTERone 150 MG/ML injection Commonly known as:  DEPO-PROVERA Inject 1 mL (150 mg total) into the muscle once.   oxyCODONE-acetaminophen 5-325 MG tablet Commonly known as:  PERCOCET/ROXICET Take 2 tablets by mouth every 4 (four) hours as needed (pain scale > 7).   Prenatal Vitamins 28-0.8 MG Tabs Take 1 tablet by mouth daily.   valACYclovir 500 MG tablet Commonly known as:  VALTREX Take 1 tablet (500 mg total) by mouth daily.            Discharge Care Instructions        Start     Ordered   03/31/17 0000  ibuprofen (ADVIL,MOTRIN) 600 MG tablet  Every 6 hours     03/31/17 0832   03/31/17 0000  oxyCODONE-acetaminophen (PERCOCET/ROXICET) 5-325 MG tablet  Every 4 hours PRN     03/31/17 0832   03/31/17 0000  docusate sodium (COLACE) 100 MG capsule  2 times daily     03/31/17 0832   03/31/17 0000  medroxyPROGESTERone (DEPO-PROVERA) 150 MG/ML injection   Once     03/31/17 1610     Outpatient follow up:  Postpartum contraception: Depo-Provera 1st dose @ hospital before discharge  Discharged Condition: good  Discharged to: home  Newborn Data: Disposition:home with mother  Apgars: APGAR (1 MIN): 6   APGAR (5 MINS): 8   APGAR (10 MINS):    Baby Feeding: Bottle  Doreene Burke, CNM 03/31/2017 8:37 AM

## 2017-04-02 ENCOUNTER — Telehealth: Payer: Self-pay

## 2017-04-02 NOTE — Telephone Encounter (Signed)
This has been taken care of.

## 2017-04-30 ENCOUNTER — Ambulatory Visit (INDEPENDENT_AMBULATORY_CARE_PROVIDER_SITE_OTHER): Payer: Medicaid Other | Admitting: Certified Nurse Midwife

## 2017-04-30 ENCOUNTER — Encounter: Payer: Self-pay | Admitting: Certified Nurse Midwife

## 2017-04-30 NOTE — Patient Instructions (Signed)

## 2017-05-01 LAB — CBC
HEMATOCRIT: 33.5 % — AB (ref 34.0–46.6)
HEMOGLOBIN: 10.3 g/dL — AB (ref 11.1–15.9)
MCH: 21.3 pg — AB (ref 26.6–33.0)
MCHC: 30.7 g/dL — AB (ref 31.5–35.7)
MCV: 69 fL — ABNORMAL LOW (ref 79–97)
Platelets: 233 10*3/uL (ref 150–379)
RBC: 4.84 x10E6/uL (ref 3.77–5.28)
RDW: 19.7 % — ABNORMAL HIGH (ref 12.3–15.4)
WBC: 5.4 10*3/uL (ref 3.4–10.8)

## 2017-05-02 NOTE — Progress Notes (Signed)
Subjective:    Jaime Simmons is a 21 y.o. G22P1001 African American female who presents for a postpartum visit. She is 4 weeks postpartum following a primary cesarean section, low transverse incision at 40+5 gestational weeks. Anesthesia: epidural. I have fully reviewed the prenatal and intrapartum course.   Postpartum course has been uncomplicated. Baby's course has been uncomplicated. Baby is feeding by formula. Bleeding changing a thin pad every 4 hours. Bowel function is normal. Bladder function is normal.   Patient is not sexually active. Contraception method is Depo-Provera injections. Postpartum depression screening: negative. Score 1.  Pap is due.  The following portions of the patient's history were reviewed and updated as appropriate: allergies, current medications, past medical history, past surgical history and problem list.  Review of Systems  Pertinent items are noted in HPI.   Objective:   BP 114/76   Pulse 79   Wt 114 lb (51.7 kg)   Breastfeeding? No   BMI 23.03 kg/m   General:  alert, cooperative and no distress   Breasts:  deferred, no complaints  Lungs: clear to auscultation bilaterally  Heart:  regular rate and rhythm  Abdomen: soft, nontender   Vulva: normal  Vagina: normal vagina  Cervix:  closed  Corpus: Well-involuted  Adnexa:  Non-palpable        Depression screen PHQ 2/9 04/30/2017  Decreased Interest 0  Down, Depressed, Hopeless 0  PHQ - 2 Score 0  Altered sleeping 0  Tired, decreased energy 1  Change in appetite 0  Feeling bad or failure about yourself  0  Trouble concentrating 0  Moving slowly or fidgety/restless 0  Suicidal thoughts 0  PHQ-9 Score 1  Difficult doing work/chores Not difficult at all    Assessment:   Postpartum exam Four (4) wks s/p primary c-section Formula feeding Depression screening Contraception counseling   Plan:   Release for work given.   Reviewed red flag symptoms and when to call.   Plans to follow up  with [facility] for care and birth control.    Gunnar Bulla, CNM

## 2017-05-12 ENCOUNTER — Encounter: Payer: Self-pay | Admitting: Obstetrics and Gynecology

## 2017-07-02 ENCOUNTER — Other Ambulatory Visit: Payer: Self-pay | Admitting: Certified Nurse Midwife

## 2017-07-02 NOTE — Telephone Encounter (Signed)
Patient called and stated that she would like top come in and get her Depo injection, And needs to have that filled so she is able to pick that up before her appointment. No other information was disclosed. Please advise.

## 2017-07-03 ENCOUNTER — Telehealth: Payer: Self-pay | Admitting: Certified Nurse Midwife

## 2017-07-03 MED ORDER — MEDROXYPROGESTERONE ACETATE 150 MG/ML IM SUSP: 150.0000 mg | Freq: Once | INTRAMUSCULAR | 0 refills | Status: AC

## 2017-07-03 NOTE — Telephone Encounter (Signed)
(  Message resent from yesterday):  Patient called and stated that she would like top come in and get her Depo injection, And needs to have that filled so she is able to pick that up before her appointment. No other information was disclosed. Please advise.

## 2017-07-03 NOTE — Telephone Encounter (Signed)
Depo refilled. Appt made for 12/14 at 8:30. Pt aware she will have to bring depo and have upt.

## 2017-07-03 NOTE — Telephone Encounter (Signed)
Spoke with pt. Depo refilled and appt made.

## 2017-07-10 ENCOUNTER — Ambulatory Visit: Payer: Medicaid Other

## 2020-01-03 ENCOUNTER — Ambulatory Visit: Payer: HRSA Program | Attending: Internal Medicine

## 2020-01-03 ENCOUNTER — Other Ambulatory Visit: Payer: Self-pay

## 2020-01-03 DIAGNOSIS — Z20822 Contact with and (suspected) exposure to covid-19: Secondary | ICD-10-CM

## 2020-01-04 LAB — SARS-COV-2, NAA 2 DAY TAT

## 2020-01-04 LAB — NOVEL CORONAVIRUS, NAA: SARS-CoV-2, NAA: NOT DETECTED

## 2020-03-22 ENCOUNTER — Ambulatory Visit: Payer: Self-pay

## 2021-01-09 DIAGNOSIS — Z3009 Encounter for other general counseling and advice on contraception: Secondary | ICD-10-CM | POA: Diagnosis not present

## 2021-01-09 DIAGNOSIS — Z3200 Encounter for pregnancy test, result unknown: Secondary | ICD-10-CM | POA: Diagnosis not present

## 2021-04-29 DIAGNOSIS — Z3009 Encounter for other general counseling and advice on contraception: Secondary | ICD-10-CM | POA: Diagnosis not present

## 2021-04-29 DIAGNOSIS — Z3200 Encounter for pregnancy test, result unknown: Secondary | ICD-10-CM | POA: Diagnosis not present

## 2021-07-26 DIAGNOSIS — Z3009 Encounter for other general counseling and advice on contraception: Secondary | ICD-10-CM | POA: Diagnosis not present

## 2021-10-11 DIAGNOSIS — Z3009 Encounter for other general counseling and advice on contraception: Secondary | ICD-10-CM | POA: Diagnosis not present

## 2022-01-07 DIAGNOSIS — Z1389 Encounter for screening for other disorder: Secondary | ICD-10-CM | POA: Diagnosis not present

## 2022-01-07 DIAGNOSIS — Z3009 Encounter for other general counseling and advice on contraception: Secondary | ICD-10-CM | POA: Diagnosis not present

## 2022-04-08 DIAGNOSIS — Z3009 Encounter for other general counseling and advice on contraception: Secondary | ICD-10-CM | POA: Diagnosis not present

## 2022-06-27 DIAGNOSIS — Z419 Encounter for procedure for purposes other than remedying health state, unspecified: Secondary | ICD-10-CM | POA: Diagnosis not present

## 2022-07-02 DIAGNOSIS — Z3009 Encounter for other general counseling and advice on contraception: Secondary | ICD-10-CM | POA: Diagnosis not present

## 2022-07-28 DIAGNOSIS — Z419 Encounter for procedure for purposes other than remedying health state, unspecified: Secondary | ICD-10-CM | POA: Diagnosis not present

## 2022-08-28 DIAGNOSIS — Z419 Encounter for procedure for purposes other than remedying health state, unspecified: Secondary | ICD-10-CM | POA: Diagnosis not present

## 2022-09-23 DIAGNOSIS — Z1389 Encounter for screening for other disorder: Secondary | ICD-10-CM | POA: Diagnosis not present

## 2022-09-23 DIAGNOSIS — Z3009 Encounter for other general counseling and advice on contraception: Secondary | ICD-10-CM | POA: Diagnosis not present

## 2022-09-26 DIAGNOSIS — Z419 Encounter for procedure for purposes other than remedying health state, unspecified: Secondary | ICD-10-CM | POA: Diagnosis not present

## 2022-09-29 ENCOUNTER — Telehealth: Payer: Self-pay

## 2022-09-29 NOTE — Telephone Encounter (Signed)
Mychart msg sent
